# Patient Record
Sex: Male | Born: 1960
Health system: Southern US, Community
[De-identification: ages and names within clinical notes are randomized; demographics above are authoritative.]

## PROBLEM LIST (undated history)

## (undated) DIAGNOSIS — I10 Essential (primary) hypertension: Secondary | ICD-10-CM

## (undated) DIAGNOSIS — D751 Secondary polycythemia: Principal | ICD-10-CM

## (undated) DIAGNOSIS — K5792 Diverticulitis of intestine, part unspecified, without perforation or abscess without bleeding: Secondary | ICD-10-CM

## (undated) DIAGNOSIS — Z9049 Acquired absence of other specified parts of digestive tract: Secondary | ICD-10-CM

## (undated) DIAGNOSIS — K219 Gastro-esophageal reflux disease without esophagitis: Secondary | ICD-10-CM

## (undated) HISTORY — PX: OTHER SURGICAL HISTORY: SHX169

## (undated) HISTORY — DX: Secondary polycythemia: D75.1

## (undated) HISTORY — DX: Gastro-esophageal reflux disease without esophagitis: K21.9

## (undated) HISTORY — DX: Essential (primary) hypertension: I10

## (undated) HISTORY — DX: Diverticulitis of intestine, part unspecified, without perforation or abscess without bleeding: K57.92

## (undated) HISTORY — DX: Acquired absence of other specified parts of digestive tract: Z90.49

---

## 2011-10-13 ENCOUNTER — Telehealth: Payer: Self-pay | Admitting: *Deleted

## 2011-10-13 NOTE — Telephone Encounter (Signed)
Attempting to reach patient regarding referral. Patient is long distance truck driver, need to get more labs for chart and instruct about potential Phlebotomy on day of visit.

## 2011-10-14 ENCOUNTER — Telehealth: Payer: Self-pay | Admitting: Oncology

## 2011-10-14 ENCOUNTER — Telehealth: Payer: Self-pay | Admitting: *Deleted

## 2011-10-14 NOTE — Telephone Encounter (Signed)
called pt lmovm to rtn call to [facility] to schedule appt

## 2011-10-14 NOTE — Telephone Encounter (Signed)
Patient returned call, he will be available next week any afternoon for New Patient Appt. He understands he is coming in because his "blood is too high". Patient given basic information on preparing for potential phlebotomy for same day as visit. He is not able to donate blood at [facility] since he has tattoos. Spoke with Marcelino Duster in treatment scheduling, they can add phlebotomy same day of visit as late as 430pm. Will schedule afternoon appt with Dr Gaylyn Rong next week. Orders sent to scheduling.

## 2011-10-17 ENCOUNTER — Telehealth: Payer: Self-pay | Admitting: Oncology

## 2011-10-17 ENCOUNTER — Encounter: Payer: Self-pay | Admitting: Oncology

## 2011-10-17 DIAGNOSIS — D751 Secondary polycythemia: Secondary | ICD-10-CM

## 2011-10-17 HISTORY — DX: Secondary polycythemia: D75.1

## 2011-10-17 NOTE — Telephone Encounter (Signed)
S/w pt re appt for 5/15 w/HH.

## 2011-10-18 NOTE — Progress Notes (Signed)
Left message reminding patient of appointment tomorrow @ 10:30.

## 2011-10-19 ENCOUNTER — Encounter: Payer: Self-pay | Admitting: Oncology

## 2011-10-19 ENCOUNTER — Ambulatory Visit: Payer: BC Managed Care – PPO

## 2011-10-19 ENCOUNTER — Ambulatory Visit (HOSPITAL_BASED_OUTPATIENT_CLINIC_OR_DEPARTMENT_OTHER): Payer: BC Managed Care – PPO | Admitting: Oncology

## 2011-10-19 ENCOUNTER — Other Ambulatory Visit (HOSPITAL_BASED_OUTPATIENT_CLINIC_OR_DEPARTMENT_OTHER): Payer: BC Managed Care – PPO | Admitting: Lab

## 2011-10-19 VITALS — BP 159/100 | HR 74 | Temp 97.7°F | Ht 74.0 in | Wt 254.9 lb

## 2011-10-19 DIAGNOSIS — D751 Secondary polycythemia: Secondary | ICD-10-CM

## 2011-10-19 DIAGNOSIS — D45 Polycythemia vera: Secondary | ICD-10-CM

## 2011-10-19 LAB — CBC WITH DIFFERENTIAL/PLATELET
Basophils Absolute: 0.1 10*3/uL (ref 0.0–0.1)
EOS%: 2.1 % (ref 0.0–7.0)
HCT: 49.9 % (ref 38.4–49.9)
HGB: 17.2 g/dL — ABNORMAL HIGH (ref 13.0–17.1)
LYMPH%: 19.9 % (ref 14.0–49.0)
MONO%: 9 % (ref 0.0–14.0)
NEUT#: 4.2 10*3/uL (ref 1.5–6.5)
Platelets: 216 10*3/uL (ref 140–400)
RBC: 5.6 10*6/uL (ref 4.20–5.82)
RDW: 13.9 % (ref 11.0–14.6)
WBC: 6.1 10*3/uL (ref 4.0–10.3)
nRBC: 0 % (ref 0–0)

## 2011-10-19 NOTE — Progress Notes (Signed)
Please see consult note; dated same day.

## 2011-10-19 NOTE — Consult Note (Signed)
[facility]  Telephone:([phone]) [phone] [phone]     INITIAL HEMATOLOGY CONSULTATION    Referral MD:  Mr. Cleaster Corin, P-AC; Dr. Rudi Heap, M.D.  Reason for Referral: polycythemia.     HPI:  [name] is a 51 year-old man with no significant PMH.  The oldest CBC provided for review dated 02/22/2010 when WBC was 4.0; hgb 16.4; Plt 210.  Since then, he has had polycythemia.  On 08/10/2010, his WBC was 12.1; Hgb 17.3; and platelet 204.  On 10/03/2011, WBC was 6.2; Hgb 18.6; Plt 206.  Given persistent polycythemia, he was kindly referred to the [facility] for evaluation.  [name] presented to the Cancer Center today by himself.  He reported feeling relatively well.  He only has complained of mild dizziness when he traveled to [place] for work.  The dizziness spontaneous resolved.  There was no head ache, visual changes, dysphagia, dysarthria, focal motor weakness.  With respect to his polycythemia, he used to donate blood with [facility].  However, with polycythemia, he has been turned down.  His last pRBC donation was about 2 years ago.  He used to smoke 1-2 cigars once or twice monthly but quit many years ago.  He denies second hand smoke.  He used to drink lots of caffenated tea which he has tried to minimized the past month due to concern of possible hemo concentration.  He is not aware if he snores or if he has apnic periods during sleep.  He wakes up feeling well rested most of the time.    Patient denies fatigue, fever, anorexia, weight loss, headache, visual changes, confusion, drenching night sweats, palpable lymph node swelling, mucositis, odynophagia, dysphagia, nausea vomiting, jaundice, chest pain, palpitation, shortness of breath, dyspnea on exertion, productive cough, gum bleeding, epistaxis, hematemesis, hemoptysis, abdominal pain, abdominal swelling, early satiety, melena, hematochezia, hematuria, skin rash, spontaneous bleeding,  joint swelling, joint pain, heat or cold intolerance, bowel bladder incontinence, back pain, focal motor weakness, paresthesia, depression, suicidal or homocidal ideation, feeling hopelessness.   Past Medical History  Diagnosis Date  . Polycythemia 10/17/2011  . Hypertension   :    Past Surgical History  Procedure Date  . Dental extractions   :   CURRENT MEDS: Current Outpatient Prescriptions  Medication Sig Dispense Refill  . amLODipine (NORVASC) 5 MG tablet Take 5 mg by mouth Daily.      . cyanocobalamin 2000 MCG tablet Take 2,000 mcg by mouth daily.          No Known Allergies:  Family History  Problem Relation Age of Onset  . Cancer Maternal Grandfather     head and neck cancer  :  History   Social History  . Marital Status: Married    Spouse Name: N/A    Number of Children: 1  . Years of Education: N/A   Occupational History  .      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 Smoking status: Former Smoker -- 1 years    Types: Cigars  . Smokeless tobacco: Not on file  . Alcohol Use: No  . Drug Use: No  . Sexually Active: Yes    Birth Control/ Protection: None   Other Topics Concern  . Not on file   Social History Narrative  . No narrative on file  :  REVIEW OF SYSTEM:  The rest of the 14-point review of sytem was negative.   Exam: ECOG  0.   General:  well-nourished in no acute distress.  Eyes:  no scleral icterus.  ENT:  There were no oropharyngeal lesions.  However, there was redundant neck tissue, and his oropharynx aperture was rather small.   Neck was without thyromegaly.  Lymphatics:  Negative cervical, supraclavicular or axillary adenopathy.  Respiratory: lungs were clear bilaterally without wheezing or crackles.  Cardiovascular:  Regular rate and rhythm, S1/S2, without murmur, rub or gallop.  There was no pedal edema.  GI:  abdomen was soft, flat, nontender, nondistended, without organomegaly.  Muscoloskeletal:  no spinal  tenderness of palpation of vertebral spine.  There was no flank tenderness on percussion.  Skin exam was without echymosis, petichae.  Neuro exam was nonfocal.  Patient was able to get on and off exam table without assistance.  Gait was normal.  Patient was alerted and oriented.  Attention was good.   Language was appropriate.  Mood was normal without depression.  Speech was not pressured.  Thought content was not tangential.    LABS:  Lab Results  Component Value Date   WBC 6.1 10/19/2011   HGB 17.2* 10/19/2011   HCT 49.9 10/19/2011   PLT 216 10/19/2011     ASSESSMENT AND PLAN:   1.  Polycythemia: - Differential diagnosis: cannot rule out polycythemia vera.  However, his presentation can very well be due to hemo concentration due to tea consumption.  With cutting back on drinking tea this past month, his Hgb has trended down toward normal.  He has no symptoms to suggest occult malignancy.  He does not have strong sleep apnea symptoms; however cannot rule this out either.   - Work up:  I sent for JAK-2 mutation to rule out PV.  I sent for erythropoietin to ensure that it's not too elevated.  I recommended patient to talk with his PCP about a referral to sleep lab to rule out sleep apnea.   - Treatment:  I advised him to start ASA at least 81mg  PO daily for primary CAD/CVA protection and also to decrease risk of venous thrombosis.  If JAK-2 mutation is positive, meaning he has polycythemia vera, then I will bring patient back for regular phlebotomy with goal Hct <45.  Once patients with PV and without history of thrombosis turn 65, they may benefit from Hydrea to decrease risk of thrombosis.  However, if he turns out to have secondary polycythemia; then there is no indication for phlebotomy since it has not been proven to decrease risk of thrombosis.    2.  Age-appropriate cancer screening:  I strongly urged him to arrange to see GI to have a screening colonoscopy.    3.  Follow up:  Pending  result of JAK-2 mutation today.   Thank you for this referral.   The length of time of the face-to-face encounter was 30 minutes. More than 50% of time was spent counseling and coordination of care.

## 2011-10-20 LAB — COMPREHENSIVE METABOLIC PANEL
Alkaline Phosphatase: 55 U/L (ref 39–117)
Creatinine, Ser: 1 mg/dL (ref 0.50–1.35)
Glucose, Bld: 88 mg/dL (ref 70–99)
Sodium: 140 mEq/L (ref 135–145)
Total Bilirubin: 0.5 mg/dL (ref 0.3–1.2)
Total Protein: 7.2 g/dL (ref 6.0–8.3)

## 2011-10-24 LAB — JAK2 GENOTYPR

## 2013-02-21 ENCOUNTER — Other Ambulatory Visit: Payer: Self-pay

## 2013-02-21 MED ORDER — LISINOPRIL 20 MG PO TABS: 20.0000 mg | ORAL_TABLET | Freq: Every day | ORAL | Status: DC

## 2013-02-21 NOTE — Telephone Encounter (Signed)
Patient needs to be seen. Has exceeded time since last visit. Needs to bring all medications to next appointment. Last refill. 

## 2013-02-21 NOTE — Telephone Encounter (Signed)
Last seen 04/23/12  ACM 

## 2013-03-19 ENCOUNTER — Ambulatory Visit (INDEPENDENT_AMBULATORY_CARE_PROVIDER_SITE_OTHER): Payer: BC Managed Care – PPO | Admitting: Family Medicine

## 2013-03-19 ENCOUNTER — Encounter: Payer: Self-pay | Admitting: Family Medicine

## 2013-03-19 VITALS — BP 143/92 | HR 70 | Temp 98.5°F | Ht 74.0 in | Wt 248.0 lb

## 2013-03-19 DIAGNOSIS — I10 Essential (primary) hypertension: Secondary | ICD-10-CM

## 2013-03-19 MED ORDER — LISINOPRIL 20 MG PO TABS: 20.0000 mg | ORAL_TABLET | Freq: Every day | ORAL | Status: DC

## 2013-03-19 NOTE — Patient Instructions (Signed)

## 2013-03-19 NOTE — Progress Notes (Signed)
  Subjective:    Patient ID: Ladonte Verstraete, male    DOB: 12-26-1960, 52 y.o.   MRN: [id number]  HPI This 52 y.o. male presents for evaluation of hypertension.  He has been out of bp meds for A couple weeks.   Review of Systems No chest pain, SOB, HA, dizziness, vision change, N/V, diarrhea, constipation, dysuria, urinary urgency or frequency, myalgias, arthralgias or rash.     Objective:   Physical Exam Vital signs noted  Well developed well nourished male.  HEENT - Head atraumatic Normocephalic                Eyes - PERRLA, Conjuctiva - clear Sclera- Clear EOMI                Ears - EAC's Wnl TM's Wnl Gross Hearing WNL                Nose - Nares patent                 Throat - oropharanx wnl Respiratory - Lungs CTA bilateral Cardiac - RRR S1 and S2 without murmur GI - Abdomen soft Nontender and bowel sounds active x 4 Extremities - No edema. Neuro - Grossly intact.       Assessment & Plan:  Essential hypertension, benign - Plan: lisinopril (PRINIVIL,ZESTRIL) 20 MG tablet Follow up in 6 months  Deatra Canter FNP

## 2014-02-10 ENCOUNTER — Other Ambulatory Visit: Payer: Self-pay | Admitting: Family Medicine

## 2014-04-14 ENCOUNTER — Other Ambulatory Visit: Payer: Self-pay | Admitting: Family Medicine

## 2014-04-14 DIAGNOSIS — I10 Essential (primary) hypertension: Secondary | ICD-10-CM

## 2014-04-14 MED ORDER — LISINOPRIL 20 MG PO TABS: 20.0000 mg | ORAL_TABLET | Freq: Every day | ORAL | Status: DC

## 2014-04-14 NOTE — Telephone Encounter (Signed)
Pt aware  done

## 2014-05-19 ENCOUNTER — Encounter: Payer: Self-pay | Admitting: Family Medicine

## 2014-05-19 ENCOUNTER — Telehealth: Payer: Self-pay | Admitting: Family Medicine

## 2014-05-19 ENCOUNTER — Ambulatory Visit (INDEPENDENT_AMBULATORY_CARE_PROVIDER_SITE_OTHER): Payer: BC Managed Care – PPO | Admitting: Family Medicine

## 2014-05-19 VITALS — BP 139/88 | HR 90 | Temp 98.1°F | Ht 74.0 in | Wt 242.0 lb

## 2014-05-19 DIAGNOSIS — I10 Essential (primary) hypertension: Secondary | ICD-10-CM

## 2014-05-19 DIAGNOSIS — N528 Other male erectile dysfunction: Secondary | ICD-10-CM

## 2014-05-19 DIAGNOSIS — Z0000 Encounter for general adult medical examination without abnormal findings: Secondary | ICD-10-CM

## 2014-05-19 DIAGNOSIS — J0100 Acute maxillary sinusitis, unspecified: Secondary | ICD-10-CM

## 2014-05-19 LAB — POCT CBC
Granulocyte percent: 72.2 %G (ref 37–80)
HCT, POC: 50.6 % (ref 43.5–53.7)
Hemoglobin: 17 g/dL (ref 14.1–18.1)
Lymph, poc: 1.9 (ref 0.6–3.4)
MCH, POC: 30.2 pg (ref 27–31.2)
MCHC: 33.7 g/dL (ref 31.8–35.4)
MCV: 89.6 fL (ref 80–97)
MPV: 7.3 fL (ref 0–99.8)
POC Granulocyte: 6.4 (ref 2–6.9)
POC LYMPH PERCENT: 21.5 %L (ref 10–50)
Platelet Count, POC: 246 10*3/uL (ref 142–424)
RBC: 5.7 M/uL (ref 4.69–6.13)
RDW, POC: 13.5 %
WBC: 8.8 10*3/uL (ref 4.6–10.2)

## 2014-05-19 MED ORDER — METHYLPREDNISOLONE ACETATE 80 MG/ML IJ SUSP: 80.0000 mg | Freq: Once | INTRAMUSCULAR | Status: DC

## 2014-05-19 MED ORDER — SILDENAFIL CITRATE 100 MG PO TABS: ORAL_TABLET | ORAL | Status: DC

## 2014-05-19 MED ORDER — LISINOPRIL 20 MG PO TABS: 20.0000 mg | ORAL_TABLET | Freq: Every day | ORAL | Status: DC

## 2014-05-19 MED ORDER — AMOXICILLIN 875 MG PO TABS: 875.0000 mg | ORAL_TABLET | Freq: Two times a day (BID) | ORAL | Status: DC

## 2014-05-19 NOTE — Progress Notes (Signed)
   Subjective:    Patient ID: Allen Herring, male    DOB: Oct 15, 1960, 53 y.o.   MRN: [id number]  HPI Patient is here with c/o uri sx's.  He is due for cpe.  He is having ED sx's.  Review of Systems  Constitutional: Negative for fever.  HENT: Negative for ear pain.   Eyes: Negative for discharge.  Respiratory: Negative for cough.   Cardiovascular: Negative for chest pain.  Gastrointestinal: Negative for abdominal distention.  Endocrine: Negative for polyuria.  Genitourinary: Negative for difficulty urinating.  Musculoskeletal: Negative for gait problem and neck pain.  Skin: Negative for color change and rash.  Neurological: Negative for speech difficulty and headaches.  Psychiatric/Behavioral: Negative for agitation.       Objective:    BP 139/88 mmHg  Pulse 90  Temp(Src) 98.1 F (36.7 C) (Oral)  Ht $R'6\' 2"'tn$  (1.88 m)  Wt 242 lb (109.77 kg)  BMI 31.06 kg/m2 Physical Exam  Constitutional: He is oriented to person, place, and time. He appears well-developed and well-nourished.  HENT:  Head: Normocephalic and atraumatic.  Mouth/Throat: Oropharynx is clear and moist.  Eyes: Pupils are equal, round, and reactive to light.  Neck: Normal range of motion. Neck supple.  Cardiovascular: Normal rate and regular rhythm.   No murmur heard. Pulmonary/Chest: Effort normal and breath sounds normal.  Abdominal: Soft. Bowel sounds are normal. There is no tenderness.  Neurological: He is alert and oriented to person, place, and time.  Skin: Skin is warm and dry.  Psychiatric: He has a normal mood and affect.          Assessment & Plan:     ICD-9-CM ICD-10-CM   1. Subacute maxillary sinusitis 461.0 J01.00 amoxicillin (AMOXIL) 875 MG tablet     methylPREDNISolone acetate (DEPO-MEDROL) injection 80 mg  2. Routine general medical examination at a health care facility V70.0 Z00.00 POCT CBC     CMP14+EGFR     Lipid panel     PSA, total and free     Thyroid Panel With TSH  3. Other  male erectile dysfunction 607.84 N52.8 sildenafil (VIAGRA) 100 MG tablet  4. Essential hypertension, benign 401.1 I10 lisinopril (PRINIVIL,ZESTRIL) 20 MG tablet  5. Essential hypertension 401.9 I10 lisinopril (PRINIVIL,ZESTRIL) 20 MG tablet     No Follow-up on file.  Lysbeth Penner FNP

## 2014-05-20 LAB — CMP14+EGFR
ALT: 32 IU/L (ref 0–44)
AST: 28 IU/L (ref 0–40)
Albumin/Globulin Ratio: 1.6 (ref 1.1–2.5)
Albumin: 4.7 g/dL (ref 3.5–5.5)
Alkaline Phosphatase: 56 IU/L (ref 39–117)
BUN/Creatinine Ratio: 16 (ref 9–20)
BUN: 17 mg/dL (ref 6–24)
CO2: 24 mmol/L (ref 18–29)
Calcium: 10.3 mg/dL — ABNORMAL HIGH (ref 8.7–10.2)
Chloride: 101 mmol/L (ref 97–108)
Creatinine, Ser: 1.08 mg/dL (ref 0.76–1.27)
GFR calc Af Amer: 89 mL/min/{1.73_m2} (ref >59)
GFR calc non Af Amer: 77 mL/min/{1.73_m2} (ref >59)
Globulin, Total: 3 g/dL (ref 1.5–4.5)
Glucose: 86 mg/dL (ref 65–99)
Potassium: 4.7 mmol/L (ref 3.5–5.2)
Sodium: 141 mmol/L (ref 134–144)
Total Bilirubin: 0.4 mg/dL (ref 0.0–1.2)
Total Protein: 7.7 g/dL (ref 6.0–8.5)

## 2014-05-20 LAB — LIPID PANEL
Chol/HDL Ratio: 6.4 ratio units — ABNORMAL HIGH (ref 0.0–5.0)
Cholesterol, Total: 191 mg/dL (ref 100–199)
HDL: 30 mg/dL — ABNORMAL LOW (ref >39)
LDL Calculated: 115 mg/dL — ABNORMAL HIGH (ref 0–99)
Triglycerides: 230 mg/dL — ABNORMAL HIGH (ref 0–149)
VLDL Cholesterol Cal: 46 mg/dL — ABNORMAL HIGH (ref 5–40)

## 2014-05-20 LAB — PSA, TOTAL AND FREE
PSA, Free Pct: 41.1 %
PSA, Free: 0.37 ng/mL
PSA: 0.9 ng/mL (ref 0.0–4.0)

## 2014-05-20 LAB — THYROID PANEL WITH TSH
Free Thyroxine Index: 1.7 (ref 1.2–4.9)
T3 Uptake Ratio: 25 % (ref 24–39)
T4, Total: 6.6 ug/dL (ref 4.5–12.0)
TSH: 1.41 u[IU]/mL (ref 0.450–4.500)

## 2014-05-21 ENCOUNTER — Telehealth: Payer: Self-pay | Admitting: Family Medicine

## 2014-05-21 NOTE — Telephone Encounter (Signed)
Refill called in. 

## 2014-07-29 ENCOUNTER — Other Ambulatory Visit: Payer: Self-pay | Admitting: Family Medicine

## 2014-07-29 DIAGNOSIS — I10 Essential (primary) hypertension: Secondary | ICD-10-CM

## 2014-07-29 MED ORDER — LISINOPRIL 20 MG PO TABS
20.0000 mg | ORAL_TABLET | Freq: Every day | ORAL | Status: DC
Start: 2014-07-29 — End: 2015-04-17

## 2014-07-29 NOTE — Telephone Encounter (Signed)
done

## 2014-09-29 ENCOUNTER — Ambulatory Visit (INDEPENDENT_AMBULATORY_CARE_PROVIDER_SITE_OTHER): Payer: 59 | Admitting: Physician Assistant

## 2014-09-29 ENCOUNTER — Encounter: Payer: Self-pay | Admitting: Physician Assistant

## 2014-09-29 VITALS — BP 146/93 | HR 113 | Temp 98.2°F | Ht 74.0 in | Wt 245.0 lb

## 2014-09-29 DIAGNOSIS — K5792 Diverticulitis of intestine, part unspecified, without perforation or abscess without bleeding: Secondary | ICD-10-CM

## 2014-09-29 DIAGNOSIS — J309 Allergic rhinitis, unspecified: Secondary | ICD-10-CM | POA: Diagnosis not present

## 2014-09-29 DIAGNOSIS — J020 Streptococcal pharyngitis: Secondary | ICD-10-CM | POA: Diagnosis not present

## 2014-09-29 DIAGNOSIS — R509 Fever, unspecified: Secondary | ICD-10-CM | POA: Diagnosis not present

## 2014-09-29 LAB — POCT INFLUENZA A/B
INFLUENZA A, POC: NEGATIVE
INFLUENZA B, POC: NEGATIVE

## 2014-09-29 LAB — POCT RAPID STREP A (OFFICE): Rapid Strep A Screen: NEGATIVE

## 2014-09-29 MED ORDER — CETIRIZINE HCL 10 MG PO TABS: 10.0000 mg | ORAL_TABLET | Freq: Every day | ORAL | Status: DC

## 2014-09-29 MED ORDER — METRONIDAZOLE 500 MG PO TABS: 500.0000 mg | ORAL_TABLET | Freq: Three times a day (TID) | ORAL | Status: DC

## 2014-09-29 MED ORDER — CIPROFLOXACIN HCL 500 MG PO TABS: 500.0000 mg | ORAL_TABLET | Freq: Two times a day (BID) | ORAL | Status: DC

## 2014-09-29 MED ORDER — FLUTICASONE PROPIONATE 50 MCG/ACT NA SUSP: 2.0000 | Freq: Every day | NASAL | Status: DC

## 2014-09-29 NOTE — Progress Notes (Signed)
   Subjective:    Patient ID: Allen Herring, male    DOB: May 11, 1961, 54 y.o.   MRN: [id number]  HPI 54 y/o male presents with c/o fever last night 102, nasal congestion, vomiting/diarrhea. Symptoms began 2 days ago. Has tried Nyquil, Dayquil with no relief. No associated sick contacts. Recent travel to [place] the past 2 weeks, 3 weeks ago to [place]. Hx of diverticulitis    Review of Systems  Constitutional: Positive for fever, chills, diaphoresis, appetite change and fatigue.  HENT: Positive for congestion (nasal ), postnasal drip, rhinorrhea, sneezing and sore throat.   Respiratory: Positive for cough (nonproductive ). Negative for wheezing.   Cardiovascular: Negative.   Gastrointestinal: Positive for nausea, vomiting and diarrhea.  Musculoskeletal: Positive for myalgias.       Objective:   Physical Exam  Constitutional: He appears well-developed and well-nourished. No distress.  HENT:  Head: Normocephalic and atraumatic.  Eyes: Pupils are equal, round, and reactive to light.  Cardiovascular:  Hypertensive, tachycardic - possibly d/t decongestant.   Pulmonary/Chest: Effort normal and breath sounds normal. No respiratory distress. He has no wheezes.  Abdominal: Soft. Bowel sounds are normal. He exhibits no distension and no mass. There is tenderness (LLQ). There is guarding. There is no rebound.  Skin: He is not diaphoretic.  Nursing note and vitals reviewed.         Assessment & Plan:  1. Fever in adult  - POCT Influenza A/B negative  2. Streptococcal sore throat  - POCT rapid strep A negative  - Culture, Group A Strep  3. Allergic rhinitis, unspecified allergic rhinitis type  - cetirizine (ZYRTEC) 10 MG tablet; Take 1 tablet (10 mg total) by mouth daily.  Dispense: 30 tablet; Refill: 11 - fluticasone (FLONASE) 50 MCG/ACT nasal spray; Place 2 sprays into both nostrils daily.  Dispense: 16 g; Refill: 6  4. Diverticulitis of intestine without perforation or  abscess without bleeding  - ciprofloxacin (CIPRO) 500 MG tablet; Take 1 tablet (500 mg total) by mouth 2 (two) times daily.  Dispense: 20 tablet; Refill: 0 - metroNIDAZOLE (FLAGYL) 500 MG tablet; Take 1 tablet (500 mg total) by mouth 3 (three) times daily.  Dispense: 21 tablet; Refill: 0 - Advised patient to report to clinic or ED if symptoms worsen ( fever, black stool, increased vomiting.)    Instructions given on diet for diverticulitis.   Harman Langhans A. Benjamin Stain PA-C

## 2014-09-29 NOTE — Patient Instructions (Signed)
Diverticulitis Diverticulitis is when small pockets that have formed in your colon (large intestine) become infected or swollen. HOME CARE  Follow your doctor's instructions.  Follow a special diet if told by your doctor.  When you feel better, your doctor may tell you to change your diet. You may be told to eat a lot of fiber. Fruits and vegetables are good sources of fiber. Fiber makes it easier to poop (have bowel movements).  Take supplements or probiotics as told by your doctor.  Only take medicines as told by your doctor.  Keep all follow-up visits with your doctor. GET HELP IF:  Your pain does not get better.  You have a hard time eating food.  You are not pooping like normal. GET HELP RIGHT AWAY IF:  Your pain gets worse.  Your problems do not get better.  Your problems suddenly get worse.  You have a fever.  You keep throwing up (vomiting).  You have bloody or black, tarry poop (stool). MAKE SURE YOU:   Understand these instructions.  Will watch your condition.  Will get help right away if you are not doing well or get worse. Document Released: 11/09/2007 Document Revised: 05/28/2013 Document Reviewed: 04/17/2013 [facility] Patient Information 2015 [place], [place]. This information is not intended to replace advice given to you by your health care provider. Make sure you discuss any questions you have with your health care provider. Allergic Rhinitis Allergic rhinitis is when the mucous membranes in the nose respond to allergens. Allergens are particles in the air that cause your body to have an allergic reaction. This causes you to release allergic antibodies. Through a chain of events, these eventually cause you to release histamine into the blood stream. Although meant to protect the body, it is this release of histamine that causes your discomfort, such as frequent sneezing, congestion, and an itchy, runny nose.  CAUSES  Seasonal allergic rhinitis (hay fever)  is caused by pollen allergens that may come from grasses, trees, and weeds. Year-round allergic rhinitis (perennial allergic rhinitis) is caused by allergens such as house dust mites, pet dander, and mold spores.  SYMPTOMS   Nasal stuffiness (congestion).  Itchy, runny nose with sneezing and tearing of the eyes. DIAGNOSIS  Your health care provider can help you determine the allergen or allergens that trigger your symptoms. If you and your health care provider are unable to determine the allergen, skin or blood testing may be used. TREATMENT  Allergic rhinitis does not have a cure, but it can be controlled by:  Medicines and allergy shots (immunotherapy).  Avoiding the allergen. Hay fever may often be treated with antihistamines in pill or nasal spray forms. Antihistamines block the effects of histamine. There are over-the-counter medicines that may help with nasal congestion and swelling around the eyes. Check with your health care provider before taking or giving this medicine.  If avoiding the allergen or the medicine prescribed do not work, there are many new medicines your health care provider can prescribe. Stronger medicine may be used if initial measures are ineffective. Desensitizing injections can be used if medicine and avoidance does not work. Desensitization is when a patient is given ongoing shots until the body becomes less sensitive to the allergen. Make sure you follow up with your health care provider if problems continue. HOME CARE INSTRUCTIONS It is not possible to completely avoid allergens, but you can reduce your symptoms by taking steps to limit your exposure to them. It helps to know exactly what  you are allergic to so that you can avoid your specific triggers. SEEK MEDICAL CARE IF:   You have a fever.  You develop a cough that does not stop easily (persistent).  You have shortness of breath.  You start wheezing.  Symptoms interfere with normal daily  activities. Document Released: 02/15/2001 Document Revised: 05/28/2013 Document Reviewed: 01/28/2013 [facility] Patient Information 2015 [place], [place]. This information is not intended to replace advice given to you by your health care provider. Make sure you discuss any questions you have with your health care provider.

## 2014-10-03 LAB — CULTURE, GROUP A STREP

## 2015-04-17 ENCOUNTER — Encounter: Payer: Self-pay | Admitting: Family Medicine

## 2015-04-17 ENCOUNTER — Ambulatory Visit (INDEPENDENT_AMBULATORY_CARE_PROVIDER_SITE_OTHER): Payer: 59 | Admitting: Family Medicine

## 2015-04-17 VITALS — BP 135/89 | HR 91 | Temp 97.4°F | Ht 74.0 in | Wt 242.4 lb

## 2015-04-17 DIAGNOSIS — N529 Male erectile dysfunction, unspecified: Secondary | ICD-10-CM | POA: Diagnosis not present

## 2015-04-17 DIAGNOSIS — I10 Essential (primary) hypertension: Secondary | ICD-10-CM | POA: Diagnosis not present

## 2015-04-17 DIAGNOSIS — Z0000 Encounter for general adult medical examination without abnormal findings: Secondary | ICD-10-CM

## 2015-04-17 DIAGNOSIS — Z131 Encounter for screening for diabetes mellitus: Secondary | ICD-10-CM | POA: Diagnosis not present

## 2015-04-17 DIAGNOSIS — Z13220 Encounter for screening for lipoid disorders: Secondary | ICD-10-CM

## 2015-04-17 MED ORDER — TADALAFIL 5 MG PO TABS: 5.0000 mg | ORAL_TABLET | Freq: Every day | ORAL | Status: DC | PRN

## 2015-04-17 MED ORDER — LISINOPRIL 20 MG PO TABS: 20.0000 mg | ORAL_TABLET | Freq: Every day | ORAL | Status: DC

## 2015-04-17 NOTE — Progress Notes (Signed)
BP 135/89 mmHg  Pulse 91  Temp(Src) 97.4 F (36.3 C) (Oral)  Ht _0  (1.88 m)  Wt 242 lb 6.4 oz (109.952 kg)  BMI 31.11 kg/m2   Subjective:    Patient ID: Allen Herring, male    DOB: [birth year], 54 y.o.   MRN: [id number]  HPI: Allen Herring is a 54 y.o. male presenting on 04/17/2015 for Hypertension   HPI Well adult exam Patient comes in today for her yearly well exam and labs. Denies any chest pains or shortness of breath or urinary problems or bowel problems.  Hypertension Patient comes in for a hypertension recheck. He has been taking lisinopril 20 mg and has been doing well on that. He denies any side effects from the medication. His blood pressure seems to be controlled mostly on that medication. Patient denies headaches, blurred vision, chest pains, shortness of breath, or weakness. Denies any side effects from medication and is content with current medication.  Erectile dysfunction Patient also comes in for erectile dysfunction, he has been taking Viagra occasionally for this. He does not like it as much or the effects that he feels from it. He would like to try Cialis. He can get an erection but just has difficulty maintaining it. He can ejaculate but not every time.  Relevant past medical, surgical, family and social history reviewed and updated as indicated. Interim medical history since our last visit reviewed. Allergies and medications reviewed and updated.  Review of Systems  Constitutional: Negative for fever and chills.  HENT: Negative for ear discharge and ear pain.   Eyes: Negative for discharge and visual disturbance.  Respiratory: Negative for shortness of breath and wheezing.   Cardiovascular: Negative for chest pain and leg swelling.  Gastrointestinal: Negative for abdominal pain, diarrhea and constipation.  Genitourinary: Negative for urgency, frequency, hematuria, flank pain, decreased urine volume, discharge, scrotal swelling, difficulty urinating  and penile pain.  Musculoskeletal: Negative for back pain and gait problem.  Skin: Negative for rash.  Neurological: Negative for dizziness, syncope, light-headedness and headaches.  All other systems reviewed and are negative.   Per HPI unless specifically indicated above     Medication List       This list is accurate as of: 04/17/15 10:27 AM.  Always use your most recent med list.               cetirizine 10 MG tablet  Commonly known as:  ZYRTEC  Take 1 tablet (10 mg total) by mouth daily.     cyanocobalamin 2000 MCG tablet  Take 2,000 mcg by mouth daily.     fluticasone 50 MCG/ACT nasal spray  Commonly known as:  FLONASE  Place 2 sprays into both nostrils daily.     lisinopril 20 MG tablet  Commonly known as:  PRINIVIL,ZESTRIL  Take 1 tablet (20 mg total) by mouth daily.     tadalafil 5 MG tablet  Commonly known as:  CIALIS  Take 1 tablet (5 mg total) by mouth daily as needed for erectile dysfunction.           Objective:    BP 135/89 mmHg  Pulse 91  Temp(Src) 97.4 F (36.3 C) (Oral)  Ht _1  (1.88 m)  Wt 242 lb 6.4 oz (109.952 kg)  BMI 31.11 kg/m2  Wt Readings from Last 3 Encounters:  04/17/15 242 lb 6.4 oz (109.952 kg)  09/29/14 245 lb (111.131 kg)  05/19/14 242 lb (109.77 kg)    Physical Exam  Constitutional:  He is oriented to person, place, and time. He appears well-developed and well-nourished. No distress.  Eyes: Conjunctivae and EOM are normal. Pupils are equal, round, and reactive to light. Right eye exhibits no discharge. No scleral icterus.  Neck: Neck supple. No thyromegaly present.  Cardiovascular: Normal rate, regular rhythm, normal heart sounds and intact distal pulses.   No murmur heard. Pulmonary/Chest: Effort normal and breath sounds normal. No respiratory distress. He has no wheezes.  Musculoskeletal: Normal range of motion. He exhibits no edema.  Lymphadenopathy:    He has no cervical adenopathy.  Neurological: He is alert and  oriented to person, place, and time. Coordination normal.  Skin: Skin is warm and dry. No rash noted. He is not diaphoretic.  Psychiatric: He has a normal mood and affect. His behavior is normal.  Nursing note and vitals reviewed.   Results for orders placed or performed in visit on 09/29/14  Culture, Group A Strep  Result Value Ref Range   Strep A Culture Comment (A)   POCT Influenza A/B  Result Value Ref Range   Influenza A, POC Negative    Influenza B, POC Negative   POCT rapid strep A  Result Value Ref Range   Rapid Strep A Screen Negative Negative      Assessment & Plan:   Problem List Items Addressed This Visit    None    Visit Diagnoses    Essential hypertension, benign    -  Primary    Relevant Medications    tadalafil (CIALIS) 5 MG tablet    lisinopril (PRINIVIL,ZESTRIL) 20 MG tablet    Other Relevant Orders    CMP14+EGFR    Well adult exam        Screening for diabetes mellitus        Relevant Orders    CMP14+EGFR    Screening, lipid        Relevant Orders    Lipid panel    Erectile dysfunction, unspecified erectile dysfunction type        Relevant Medications    tadalafil (CIALIS) 5 MG tablet       Patient did not want to do a colonoscopy at this time, he also does not want prostate screening. He also does not want a flu shot at this time.  Follow up plan: Return in about 1 year (around 04/16/2016), or if symptoms worsen or fail to improve, for htn recheck.  Caryl Pina, MD [facility] 04/17/2015, 10:27 AM

## 2015-04-18 LAB — CMP14+EGFR
ALBUMIN: 4.8 g/dL (ref 3.5–5.5)
ALT: 35 IU/L (ref 0–44)
AST: 31 IU/L (ref 0–40)
Albumin/Globulin Ratio: 1.8 (ref 1.1–2.5)
Alkaline Phosphatase: 55 IU/L (ref 39–117)
BUN / CREAT RATIO: 15 (ref 9–20)
BUN: 15 mg/dL (ref 6–24)
Bilirubin Total: 0.5 mg/dL (ref 0.0–1.2)
CALCIUM: 10.1 mg/dL (ref 8.7–10.2)
CO2: 21 mmol/L (ref 18–29)
CREATININE: 0.97 mg/dL (ref 0.76–1.27)
Chloride: 101 mmol/L (ref 97–106)
GFR calc Af Amer: 101 mL/min/{1.73_m2} (ref >59)
GFR, EST NON AFRICAN AMERICAN: 88 mL/min/{1.73_m2} (ref >59)
GLOBULIN, TOTAL: 2.6 g/dL (ref 1.5–4.5)
Glucose: 94 mg/dL (ref 65–99)
Potassium: 4.3 mmol/L (ref 3.5–5.2)
SODIUM: 141 mmol/L (ref 136–144)
TOTAL PROTEIN: 7.4 g/dL (ref 6.0–8.5)

## 2015-04-18 LAB — LIPID PANEL
Chol/HDL Ratio: 6.1 ratio units — ABNORMAL HIGH (ref 0.0–5.0)
Cholesterol, Total: 188 mg/dL (ref 100–199)
HDL: 31 mg/dL — ABNORMAL LOW (ref >39)
LDL CALC: 104 mg/dL — AB (ref 0–99)
Triglycerides: 263 mg/dL — ABNORMAL HIGH (ref 0–149)
VLDL CHOLESTEROL CAL: 53 mg/dL — AB (ref 5–40)

## 2015-04-21 ENCOUNTER — Other Ambulatory Visit: Payer: Self-pay

## 2015-04-21 MED ORDER — FENOFIBRATE 54 MG PO TABS: 54.0000 mg | ORAL_TABLET | Freq: Every day | ORAL | Status: DC

## 2015-04-21 NOTE — Progress Notes (Signed)
Patient informed, medication called in to [facility]

## 2015-06-23 ENCOUNTER — Encounter: Payer: Self-pay | Admitting: Pediatrics

## 2015-06-23 ENCOUNTER — Ambulatory Visit (INDEPENDENT_AMBULATORY_CARE_PROVIDER_SITE_OTHER): Payer: 59 | Admitting: Pediatrics

## 2015-06-23 VITALS — BP 158/102 | HR 79 | Temp 98.1°F | Ht 74.0 in | Wt 250.8 lb

## 2015-06-23 DIAGNOSIS — R6889 Other general symptoms and signs: Secondary | ICD-10-CM

## 2015-06-23 DIAGNOSIS — J069 Acute upper respiratory infection, unspecified: Secondary | ICD-10-CM | POA: Diagnosis not present

## 2015-06-23 NOTE — Patient Instructions (Addendum)
Netipot with distilled water 2-3 times a day to clear out sinuses Or Normal saline nasal spray Flonase steroid nasal spray Ibuprofen or nabutetome Lots of fluids  Take your blood pressure at home Let me know if regularly >140 on top or >90 on bottom

## 2015-06-23 NOTE — Progress Notes (Signed)
    Subjective:    Patient ID: Allen Herring, male    DOB: 07-13-1960, 55 y.o.   MRN: [id number]  CC: Cough; Shortness of Breath; Fever; and Nasal Congestion   HPI: Allen Herring is a 55 y.o. male presenting for Cough; Shortness of Breath; Fever; and Nasal Congestion  Still coughing, coughing some up Started 4 days ago Some people at work have been sick Some fevers Feels SOB with exertion now Taking tylenol flu, day and night Muscle aches, body aches   Depression screen [facility] 2/9 06/23/2015 04/17/2015  Decreased Interest 0 0  Down, Depressed, Hopeless 0 0  PHQ - 2 Score 0 0     Relevant past medical, surgical, family and social history reviewed and updated as indicated. Interim medical history since our last visit reviewed. Allergies and medications reviewed and updated.    ROS: Per HPI unless specifically indicated above  History  Smoking status  . Former Smoker -- 1 years  . Types: Cigars  Smokeless tobacco  . Never Used    Past Medical History Patient Active Problem List   Diagnosis Date Noted  . Polycythemia 10/17/2011    Current Outpatient Prescriptions  Medication Sig Dispense Refill  . fluticasone (FLONASE) 50 MCG/ACT nasal spray Place 2 sprays into both nostrils daily. 16 g 6  . lisinopril (PRINIVIL,ZESTRIL) 20 MG tablet Take 1 tablet (20 mg total) by mouth daily. 90 tablet 6  . nabumetone (RELAFEN) 750 MG tablet Take 750 mg by mouth 2 (two) times daily.     No current facility-administered medications for this visit.       Objective:    BP 158/102 mmHg  Pulse 79  Temp(Src) 98.1 F (36.7 C) (Oral)  Ht 6\' 2"  (1.88 m)  Wt 250 lb 12.8 oz (113.762 kg)  BMI 32.19 kg/m2  SpO2 97%  Wt Readings from Last 3 Encounters:  06/23/15 250 lb 12.8 oz (113.762 kg)  04/17/15 242 lb 6.4 oz (109.952 kg)  09/29/14 245 lb (111.131 kg)    Gen: NAD, alert, cooperative with exam, NCAT, warm, sweaty EYES: EOMI, no scleral injection or icterus ENT:  TMs pearly  gray b/l, OP with mild erythema LYMPH: +<1cm ant cervical LAD CV: NRRR, normal S1/S2, no murmur, distal pulses 2+ b/l Resp: CTABL, no wheezes, normal WOB Abd: +BS, soft, NTND. no guarding or organomegaly Ext: No edema, warm Neuro: Alert and oriented, strength equal b/l UE and LE, coordination grossly normal MSK: normal muscle bulk     Assessment & Plan:    Allen Herring was seen today for flu-like symptoms. Ongoing past 4 days, too late for tamiflu. Offered flu test, but discussed too late to treat with tamiflu. Discussed symptomatic care, return precautions.  Diagnoses and all orders for this visit:  Acute URI  Flu-like symptoms    Follow up plan: Return if symptoms worsen or fail to improve, for 2 weeks for BP recheck with nurse.  Assunta Found, MD [facility] 06/23/2015, 2:22 PM

## 2015-10-28 ENCOUNTER — Telehealth: Payer: Self-pay | Admitting: Family Medicine

## 2015-10-30 ENCOUNTER — Ambulatory Visit: Payer: 59 | Admitting: Family Medicine

## 2015-11-06 ENCOUNTER — Telehealth: Payer: Self-pay | Admitting: Family Medicine

## 2015-11-06 NOTE — Telephone Encounter (Signed)
Going to request records from [facility]

## 2016-05-06 HISTORY — PX: CERVICAL SPINE SURGERY: SHX589

## 2016-05-19 ENCOUNTER — Other Ambulatory Visit: Payer: Self-pay | Admitting: Family Medicine

## 2016-05-19 DIAGNOSIS — I10 Essential (primary) hypertension: Secondary | ICD-10-CM

## 2016-06-28 ENCOUNTER — Encounter: Payer: Self-pay | Admitting: Family Medicine

## 2016-06-28 ENCOUNTER — Ambulatory Visit (INDEPENDENT_AMBULATORY_CARE_PROVIDER_SITE_OTHER): Payer: 59 | Admitting: Family Medicine

## 2016-06-28 VITALS — BP 127/88 | HR 114 | Temp 99.5°F | Ht 74.0 in | Wt 242.0 lb

## 2016-06-28 DIAGNOSIS — R509 Fever, unspecified: Secondary | ICD-10-CM | POA: Diagnosis not present

## 2016-06-28 DIAGNOSIS — I10 Essential (primary) hypertension: Secondary | ICD-10-CM

## 2016-06-28 MED ORDER — LISINOPRIL 20 MG PO TABS: 20.0000 mg | ORAL_TABLET | Freq: Every day | ORAL | 1 refills | Status: DC

## 2016-06-28 MED ORDER — OSELTAMIVIR PHOSPHATE 75 MG PO CAPS: 75.0000 mg | ORAL_CAPSULE | Freq: Two times a day (BID) | ORAL | 0 refills | Status: DC

## 2016-06-28 NOTE — Progress Notes (Signed)
   Subjective:    Patient ID: Allen Herring, male    DOB: 17-Mar-1961, 56 y.o.   MRN: [id number]  HPI onset yesterday afternoon of fever and myalgias. There is some slight cough and some headache. He has been exposed to flu. OTC medicines include Dayquil  Patient Active Problem List   Diagnosis Date Noted  . Polycythemia 10/17/2011   Outpatient Encounter Prescriptions as of 06/28/2016  Medication Sig  . cyclobenzaprine (FLEXERIL) 10 MG tablet Take 10 mg by mouth.  . docusate sodium (COLACE) 100 MG capsule Take 100 mg by mouth 2 (two) times daily as needed for mild constipation.  . gabapentin (NEURONTIN) 300 MG capsule Take 300 mg by mouth 3 (three) times daily.  Marland Kitchen lisinopril (PRINIVIL,ZESTRIL) 20 MG tablet Take 1 tablet (20 mg total) by mouth daily.  . [DISCONTINUED] lisinopril (PRINIVIL,ZESTRIL) 20 MG tablet TAKE ONE TABLET BY MOUTH ONCE DAILY  . fluticasone (FLONASE) 50 MCG/ACT nasal spray Place 2 sprays into both nostrils daily. (Patient not taking: Reported on 06/28/2016)  . oseltamivir (TAMIFLU) 75 MG capsule Take 1 capsule (75 mg total) by mouth 2 (two) times daily.  . [DISCONTINUED] nabumetone (RELAFEN) 750 MG tablet Take 750 mg by mouth 2 (two) times daily.   No facility-administered encounter medications on file as of 06/28/2016.       Review of Systems  Constitutional: Positive for fever.  Respiratory: Positive for cough.   Musculoskeletal: Positive for myalgias.       Objective:   Physical Exam  Constitutional: He is oriented to person, place, and time. He appears well-developed and well-nourished.  HENT:  Mouth/Throat: Oropharynx is clear and moist.  Cardiovascular: Normal rate, regular rhythm and normal heart sounds.   Pulmonary/Chest: Effort normal and breath sounds normal.  Neurological: He is oriented to person, place, and time.   BP 127/88   Pulse (!) 114   Temp 99.5 F (37.5 C) (Oral)   Ht 6\' 2"  (1.88 m)   Wt 242 lb (109.8 kg)   BMI 31.07 kg/m         Assessment & Plan:  1. Essential hypertension, benign Blood pressure is fairly well controlled on lisinopril. Continue same - lisinopril (PRINIVIL,ZESTRIL) 20 MG tablet; Take 1 tablet (20 mg total) by mouth daily.  Dispense: 90 tablet; Refill: 1  2. Acute febrile illness We discussed and offered him flu test but am willing to treat based on symptoms. He does not have classical flu symptoms but I think with fever over 102 and myalgias that's the likely diagnosis. Rx: Tamiflu 75 mg twice a day. He has hydrocodone from his recent neck surgery and he may take for  Wardell Honour MD

## 2016-08-16 ENCOUNTER — Other Ambulatory Visit: Payer: Self-pay | Admitting: Family Medicine

## 2016-08-16 DIAGNOSIS — I10 Essential (primary) hypertension: Secondary | ICD-10-CM

## 2016-08-22 ENCOUNTER — Other Ambulatory Visit: Payer: Self-pay | Admitting: Family Medicine

## 2016-08-22 ENCOUNTER — Telehealth: Payer: Self-pay | Admitting: Family Medicine

## 2016-08-22 DIAGNOSIS — I10 Essential (primary) hypertension: Secondary | ICD-10-CM

## 2016-08-22 MED ORDER — LISINOPRIL 20 MG PO TABS: 20.0000 mg | ORAL_TABLET | Freq: Every day | ORAL | 1 refills | Status: DC

## 2016-08-22 NOTE — Telephone Encounter (Signed)
done

## 2016-10-18 ENCOUNTER — Encounter: Payer: Self-pay | Admitting: Family

## 2016-10-18 ENCOUNTER — Ambulatory Visit (INDEPENDENT_AMBULATORY_CARE_PROVIDER_SITE_OTHER): Payer: 59 | Admitting: Family

## 2016-10-18 VITALS — BP 128/83 | HR 80 | Temp 97.8°F | Ht 74.0 in | Wt 241.8 lb

## 2016-10-18 DIAGNOSIS — I10 Essential (primary) hypertension: Secondary | ICD-10-CM | POA: Diagnosis not present

## 2016-10-18 DIAGNOSIS — J301 Allergic rhinitis due to pollen: Secondary | ICD-10-CM

## 2016-10-18 MED ORDER — LISINOPRIL 20 MG PO TABS: 20.0000 mg | ORAL_TABLET | Freq: Every day | ORAL | 3 refills | Status: DC

## 2016-10-18 MED ORDER — FLUTICASONE PROPIONATE 50 MCG/ACT NA SUSP: 2.0000 | Freq: Every day | NASAL | 6 refills | Status: DC

## 2016-10-18 MED ORDER — CETIRIZINE HCL 10 MG PO TABS: 10.0000 mg | ORAL_TABLET | Freq: Every day | ORAL | 11 refills | Status: DC

## 2016-10-18 NOTE — Patient Instructions (Signed)
Allergic Rhinitis Allergic rhinitis is when the mucous membranes in the nose respond to allergens. Allergens are particles in the air that cause your body to have an allergic reaction. This causes you to release allergic antibodies. Through a chain of events, these eventually cause you to release histamine into the blood stream. Although meant to protect the body, it is this release of histamine that causes your discomfort, such as frequent sneezing, congestion, and an itchy, runny nose. What are the causes? Seasonal allergic rhinitis (hay fever) is caused by pollen allergens that may come from grasses, trees, and weeds. Year-round allergic rhinitis (perennial allergic rhinitis) is caused by allergens such as house dust mites, pet dander, and mold spores. What are the signs or symptoms?  Nasal stuffiness (congestion).  Itchy, runny nose with sneezing and tearing of the eyes. How is this diagnosed? Your health care provider can help you determine the allergen or allergens that trigger your symptoms. If you and your health care provider are unable to determine the allergen, skin or blood testing may be used. Your health care provider will diagnose your condition after taking your health history and performing a physical exam. Your health care provider may assess you for other related conditions, such as asthma, pink eye, or an ear infection. How is this treated? Allergic rhinitis does not have a cure, but it can be controlled by:  Medicines that block allergy symptoms. These may include allergy shots, nasal sprays, and oral antihistamines.  Avoiding the allergen. Hay fever may often be treated with antihistamines in pill or nasal spray forms. Antihistamines block the effects of histamine. There are over-the-counter medicines that may help with nasal congestion and swelling around the eyes. Check with your health care provider before taking or giving this medicine. If avoiding the allergen or the  medicine prescribed do not work, there are many new medicines your health care provider can prescribe. Stronger medicine may be used if initial measures are ineffective. Desensitizing injections can be used if medicine and avoidance does not work. Desensitization is when a patient is given ongoing shots until the body becomes less sensitive to the allergen. Make sure you follow up with your health care provider if problems continue. Follow these instructions at home: It is not possible to completely avoid allergens, but you can reduce your symptoms by taking steps to limit your exposure to them. It helps to know exactly what you are allergic to so that you can avoid your specific triggers. Contact a health care provider if:  You have a fever.  You develop a cough that does not stop easily (persistent).  You have shortness of breath.  You start wheezing.  Symptoms interfere with normal daily activities. This information is not intended to replace advice given to you by your health care provider. Make sure you discuss any questions you have with your health care provider. Document Released: 02/15/2001 Document Revised: 01/22/2016 Document Reviewed: 01/28/2013 Elsevier Interactive Patient Education  2017 Elsevier Inc.  

## 2016-10-18 NOTE — Progress Notes (Signed)
   Subjective:    Patient ID: Allen Herring, male    DOB: 1960/06/12, 56 y.o.   MRN: [id number]  Sinus Problem  The current episode started in the past 7 days. The problem has been waxing and waning since onset. There has been no fever. His pain is at a severity of 4/10. The pain is mild. Associated symptoms include headaches and sinus pressure. Pertinent negatives include no congestion, coughing, ear pain, hoarse voice, shortness of breath or sore throat. Past treatments include nothing.  Hypertension  This is a chronic problem. The current episode started more than 1 year ago. The problem has been resolved since onset. The problem is controlled. Associated symptoms include headaches. Pertinent negatives include no malaise/fatigue, peripheral edema or shortness of breath. The current treatment provides moderate improvement.      Review of Systems  Constitutional: Negative for malaise/fatigue.  HENT: Positive for sinus pressure. Negative for congestion, ear pain, hoarse voice and sore throat.   Respiratory: Negative for cough and shortness of breath.   Neurological: Positive for headaches.  All other systems reviewed and are negative.      Objective:   Physical Exam  Constitutional: He is oriented to person, place, and time. He appears well-developed and well-nourished. No distress.  HENT:  Head: Normocephalic.  Right Ear: External ear normal.  Left Ear: External ear normal.  Mouth/Throat: Oropharynx is clear and moist.  Eyes: Pupils are equal, round, and reactive to light. Right eye exhibits no discharge. Left eye exhibits no discharge.  Neck: Normal range of motion. Neck supple. No thyromegaly present.  Cardiovascular: Normal rate, regular rhythm, normal heart sounds and intact distal pulses.   No murmur heard. Pulmonary/Chest: Effort normal and breath sounds normal. No respiratory distress. He has no wheezes.  Abdominal: Soft. Bowel sounds are normal. He exhibits no distension.  There is no tenderness.  Musculoskeletal: Normal range of motion. He exhibits no edema or tenderness.  Neurological: He is alert and oriented to person, place, and time.  Skin: Skin is warm and dry. No rash noted. No erythema.  Psychiatric: He has a normal mood and affect. His behavior is normal. Judgment and thought content normal.  Vitals reviewed.     BP 128/83   Pulse 80   Temp 97.8 F (36.6 C) (Oral)   Ht 6\' 2"  (1.88 m)   Wt 241 lb 12.8 oz (109.7 kg)   BMI 31.05 kg/m      Assessment & Plan:  1. Essential hypertension -Dash diet information given -Exercise encouraged - Stress Management  -Continue current meds -RTO in as needed and keep chronic follow up with PCP - lisinopril (PRINIVIL,ZESTRIL) 20 MG tablet; Take 1 tablet (20 mg total) by mouth daily.  Dispense: 90 tablet; Refill: 3  2. Allergic rhinitis due to pollen, unspecified seasonality -Avoid allergens Start zyrtec and flonase daily RTO prn  - fluticasone (FLONASE) 50 MCG/ACT nasal spray; Place 2 sprays into both nostrils daily.  Dispense: 16 g; Refill: 6 - cetirizine (ZYRTEC) 10 MG tablet; Take 1 tablet (10 mg total) by mouth daily.  Dispense: 30 tablet; Refill: [place], FNP

## 2017-08-07 ENCOUNTER — Encounter: Payer: Self-pay | Admitting: Nurse Practitioner

## 2017-08-07 ENCOUNTER — Ambulatory Visit: Payer: 59 | Admitting: Nurse Practitioner

## 2017-08-07 VITALS — BP 133/93 | HR 76 | Temp 97.7°F | Ht 74.0 in | Wt 241.6 lb

## 2017-08-07 DIAGNOSIS — J301 Allergic rhinitis due to pollen: Secondary | ICD-10-CM | POA: Diagnosis not present

## 2017-08-07 DIAGNOSIS — I10 Essential (primary) hypertension: Secondary | ICD-10-CM | POA: Diagnosis not present

## 2017-08-07 DIAGNOSIS — J0100 Acute maxillary sinusitis, unspecified: Secondary | ICD-10-CM

## 2017-08-07 DIAGNOSIS — M25532 Pain in left wrist: Secondary | ICD-10-CM

## 2017-08-07 DIAGNOSIS — D751 Secondary polycythemia: Secondary | ICD-10-CM | POA: Diagnosis not present

## 2017-08-07 DIAGNOSIS — J309 Allergic rhinitis, unspecified: Secondary | ICD-10-CM | POA: Insufficient documentation

## 2017-08-07 MED ORDER — AMOXICILLIN-POT CLAVULANATE 875-125 MG PO TABS: 1.0000 | ORAL_TABLET | Freq: Two times a day (BID) | ORAL | 0 refills | Status: DC

## 2017-08-07 MED ORDER — FLUTICASONE PROPIONATE 50 MCG/ACT NA SUSP: 2.0000 | Freq: Every day | NASAL | 6 refills | Status: DC

## 2017-08-07 MED ORDER — LISINOPRIL 20 MG PO TABS: 20.0000 mg | ORAL_TABLET | Freq: Every day | ORAL | 1 refills | Status: DC

## 2017-08-07 NOTE — Patient Instructions (Signed)

## 2017-08-07 NOTE — Progress Notes (Signed)
Subjective:    Patient ID: Allen Herring, male    DOB: 03/01/1961, 57 y.o.   MRN: [id number]  HPI  Allen Herring is here today for follow up of chronic medical problem.  Outpatient Encounter Medications as of 08/07/2017  Medication Sig  . fluticasone (FLONASE) 50 MCG/ACT nasal spray Place 2 sprays into both nostrils daily.  Marland Kitchen lisinopril (PRINIVIL,ZESTRIL) 20 MG tablet Take 1 tablet (20 mg total) by mouth daily.    1. Essential hypertension  No chest pain, sob or headaches. Does  Not check blood pressure at home. He has been out of blood pressure meds for 3 days. BP Readings from Last 3 Encounters:  08/07/17 (!) 133/93  10/18/16 128/83  06/28/16 127/88     2. Polycythemia  He is unsure about this. He was told at one tome that his blood count was messed up but they did not do anything about it and he was not sent to specialist.  3. Seasonal allergic rhinitis due to pollen  He is currently on nasal spray which works well for him.    New complaints: -He is c/o fever- 101 and 102 yesterday evening. Cough , congestion and slight body aches.  - left wrist pain- had workers comp injury close to one year ago and has not been settled as of yet. Pain in wrist is increasing and he wants referral back to [facility].  Social history: Works at [employer]. Lives with girlfriend     Review of Systems  Constitutional: Positive for fever. Negative for appetite change and chills.  HENT: Positive for congestion, sinus pressure, sinus pain and sore throat. Negative for ear pain and trouble swallowing.   Respiratory: Positive for cough (slightly productive).   Cardiovascular: Negative.   Genitourinary: Negative.   Neurological: Negative.   Psychiatric/Behavioral: Negative.   All other systems reviewed and are negative.      Objective:   Physical Exam  Constitutional: He is oriented to person, place, and time. He appears well-developed and well-nourished. No distress.    HENT:  Right Ear: Hearing, tympanic membrane, external ear and ear canal normal.  Left Ear: Hearing, tympanic membrane, external ear and ear canal normal.  Nose: Mucosal edema and rhinorrhea present. Right sinus exhibits maxillary sinus tenderness. Right sinus exhibits no frontal sinus tenderness. Left sinus exhibits maxillary sinus tenderness. Left sinus exhibits no frontal sinus tenderness.  Mouth/Throat: Uvula is midline, oropharynx is clear and moist and mucous membranes are normal.  Neck: Normal range of motion. Neck supple.  Cardiovascular: Normal rate and regular rhythm.  Pulmonary/Chest: Effort normal and breath sounds normal.  Abdominal: Soft. Bowel sounds are normal.  Musculoskeletal:  FROM of left wrist withpot pain- but has pain if hand if gripping something and moving wrist. No edema  Lymphadenopathy:    He has no cervical adenopathy.  Neurological: He is alert and oriented to person, place, and time.  Skin: Skin is warm.  Psychiatric: He has a normal mood and affect. His behavior is normal. Judgment and thought content normal.    BP (!) 133/93   Pulse 76   Temp 97.7 F (36.5 C) (Oral)   Ht '6\' 2"'$  (1.88 m)   Wt 241 lb 9.6 oz (109.6 kg)   BMI 31.02 kg/m        Assessment & Plan:  1. Essential hypertension Low sodium diet - CMP14+EGFR - Lipid panel - lisinopril (PRINIVIL,ZESTRIL) 20 MG tablet; Take 1 tablet (20 mg total) by mouth daily.  Dispense: 90  tablet; Refill: 1  2. Polycythemia Labs pending - CBC with Differential/Platelet  3. Seasonal allergic rhinitis due to pollen - fluticasone (FLONASE) 50 MCG/ACT nasal spray; Place 2 sprays into both nostrils daily.  Dispense: 16 g; Refill: 6  4. Left wrist pain - Ambulatory referral to Orthopedic Surgery  5. Acute maxillary sinusitis, recurrence not specified 1. Take meds as prescribed 2. Use a cool mist humidifier especially during the winter months and when heat has been humid. 3. Use saline nose sprays  frequently 4. Saline irrigations of the nose can be very helpful if done frequently.  * 4X daily for 1 week*  * Use of a nettie pot can be helpful with this. Follow directions with this* 5. Drink plenty of fluids 6. Keep thermostat turn down low 7.For any cough or congestion  Use plain Mucinex- regular strength or max strength is fine   * Children- consult with Pharmacist for dosing 8. For fever or aces or pains- take tylenol or ibuprofen appropriate for age and weight.  * for fevers greater than 101 orally you may alternate ibuprofen and tylenol every  3 hours.    - amoxicillin-clavulanate (AUGMENTIN) 875-125 MG tablet; Take 1 tablet by mouth 2 (two) times daily.  Dispense: 20 tablet; Refill: 0    Labs pending Health maintenance reviewed Diet and exercise encouraged Continue all meds Follow up  In 6 months   [place], FNP

## 2017-08-08 LAB — CMP14+EGFR
A/G RATIO: 1.7 (ref 1.2–2.2)
ALT: 31 IU/L (ref 0–44)
AST: 30 IU/L (ref 0–40)
Albumin: 4.7 g/dL (ref 3.5–5.5)
Alkaline Phosphatase: 56 IU/L (ref 39–117)
BUN/Creatinine Ratio: 13 (ref 9–20)
BUN: 12 mg/dL (ref 6–24)
Bilirubin Total: 0.5 mg/dL (ref 0.0–1.2)
CALCIUM: 9.8 mg/dL (ref 8.7–10.2)
CO2: 23 mmol/L (ref 20–29)
Chloride: 103 mmol/L (ref 96–106)
Creatinine, Ser: 0.95 mg/dL (ref 0.76–1.27)
GFR calc Af Amer: 102 mL/min/{1.73_m2} (ref >59)
GFR, EST NON AFRICAN AMERICAN: 88 mL/min/{1.73_m2} (ref >59)
GLOBULIN, TOTAL: 2.8 g/dL (ref 1.5–4.5)
Glucose: 87 mg/dL (ref 65–99)
POTASSIUM: 4.4 mmol/L (ref 3.5–5.2)
SODIUM: 141 mmol/L (ref 134–144)
TOTAL PROTEIN: 7.5 g/dL (ref 6.0–8.5)

## 2017-08-08 LAB — LIPID PANEL
Chol/HDL Ratio: 4.9 ratio (ref 0.0–5.0)
Cholesterol, Total: 172 mg/dL (ref 100–199)
HDL: 35 mg/dL — ABNORMAL LOW (ref >39)
LDL Calculated: 109 mg/dL — ABNORMAL HIGH (ref 0–99)
Triglycerides: 142 mg/dL (ref 0–149)
VLDL CHOLESTEROL CAL: 28 mg/dL (ref 5–40)

## 2017-08-08 LAB — CBC WITH DIFFERENTIAL/PLATELET
BASOS: 0 %
Basophils Absolute: 0 10*3/uL (ref 0.0–0.2)
EOS (ABSOLUTE): 0.3 10*3/uL (ref 0.0–0.4)
EOS: 4 %
HEMATOCRIT: 48 % (ref 37.5–51.0)
Hemoglobin: 17.1 g/dL (ref 13.0–17.7)
IMMATURE GRANS (ABS): 0 10*3/uL (ref 0.0–0.1)
Immature Granulocytes: 0 %
Lymphocytes Absolute: 1.3 10*3/uL (ref 0.7–3.1)
Lymphs: 19 %
MCH: 30.4 pg (ref 26.6–33.0)
MCHC: 35.6 g/dL (ref 31.5–35.7)
MCV: 85 fL (ref 79–97)
MONOS ABS: 0.7 10*3/uL (ref 0.1–0.9)
Monocytes: 9 %
NEUTROS ABS: 4.7 10*3/uL (ref 1.4–7.0)
NEUTROS PCT: 68 %
Platelets: 208 10*3/uL (ref 150–379)
RBC: 5.62 x10E6/uL (ref 4.14–5.80)
RDW: 14.3 % (ref 12.3–15.4)
WBC: 7 10*3/uL (ref 3.4–10.8)

## 2017-09-13 ENCOUNTER — Ambulatory Visit (INDEPENDENT_AMBULATORY_CARE_PROVIDER_SITE_OTHER): Payer: 59 | Admitting: Specialist

## 2017-09-21 DIAGNOSIS — M722 Plantar fascial fibromatosis: Secondary | ICD-10-CM | POA: Diagnosis not present

## 2017-09-21 DIAGNOSIS — M79672 Pain in left foot: Secondary | ICD-10-CM | POA: Diagnosis not present

## 2017-10-12 DIAGNOSIS — M722 Plantar fascial fibromatosis: Secondary | ICD-10-CM | POA: Diagnosis not present

## 2017-10-12 DIAGNOSIS — M79672 Pain in left foot: Secondary | ICD-10-CM | POA: Diagnosis not present

## 2017-10-17 DIAGNOSIS — G5602 Carpal tunnel syndrome, left upper limb: Secondary | ICD-10-CM | POA: Insufficient documentation

## 2017-10-17 DIAGNOSIS — M79642 Pain in left hand: Secondary | ICD-10-CM | POA: Insufficient documentation

## 2017-10-17 DIAGNOSIS — G5622 Lesion of ulnar nerve, left upper limb: Secondary | ICD-10-CM | POA: Insufficient documentation

## 2017-10-30 ENCOUNTER — Other Ambulatory Visit: Payer: Self-pay | Admitting: Nurse Practitioner

## 2017-10-30 DIAGNOSIS — I10 Essential (primary) hypertension: Secondary | ICD-10-CM

## 2017-11-06 DIAGNOSIS — G5602 Carpal tunnel syndrome, left upper limb: Secondary | ICD-10-CM | POA: Diagnosis not present

## 2017-11-06 DIAGNOSIS — G5622 Lesion of ulnar nerve, left upper limb: Secondary | ICD-10-CM | POA: Diagnosis not present

## 2017-11-14 ENCOUNTER — Other Ambulatory Visit: Payer: Self-pay | Admitting: Nurse Practitioner

## 2017-11-14 DIAGNOSIS — I10 Essential (primary) hypertension: Secondary | ICD-10-CM

## 2017-11-14 MED ORDER — LISINOPRIL 20 MG PO TABS: 20.0000 mg | ORAL_TABLET | Freq: Every day | ORAL | 1 refills | Status: DC

## 2017-11-14 NOTE — Telephone Encounter (Signed)
Refill sent. Patient aware.  

## 2017-12-11 ENCOUNTER — Ambulatory Visit: Payer: BLUE CROSS/BLUE SHIELD | Admitting: Family Medicine

## 2017-12-11 ENCOUNTER — Ambulatory Visit (HOSPITAL_COMMUNITY)
Admission: RE | Admit: 2017-12-11 | Discharge: 2017-12-11 | Disposition: A | Discharge status: Home / Self Care | Payer: BLUE CROSS/BLUE SHIELD | Source: Ambulatory Visit | Attending: Family Medicine | Admitting: Family Medicine

## 2017-12-11 ENCOUNTER — Telehealth: Payer: Self-pay | Admitting: Family Medicine

## 2017-12-11 ENCOUNTER — Encounter: Payer: Self-pay | Admitting: Family Medicine

## 2017-12-11 VITALS — BP 111/74 | HR 100 | Temp 101.9°F | Ht 74.0 in | Wt 243.0 lb

## 2017-12-11 DIAGNOSIS — R1110 Vomiting, unspecified: Secondary | ICD-10-CM | POA: Diagnosis not present

## 2017-12-11 DIAGNOSIS — R1030 Lower abdominal pain, unspecified: Secondary | ICD-10-CM | POA: Diagnosis not present

## 2017-12-11 DIAGNOSIS — R1031 Right lower quadrant pain: Secondary | ICD-10-CM

## 2017-12-11 DIAGNOSIS — K5792 Diverticulitis of intestine, part unspecified, without perforation or abscess without bleeding: Secondary | ICD-10-CM | POA: Diagnosis not present

## 2017-12-11 DIAGNOSIS — R109 Unspecified abdominal pain: Secondary | ICD-10-CM | POA: Diagnosis not present

## 2017-12-11 DIAGNOSIS — I700 Atherosclerosis of aorta: Secondary | ICD-10-CM | POA: Diagnosis not present

## 2017-12-11 DIAGNOSIS — R112 Nausea with vomiting, unspecified: Secondary | ICD-10-CM | POA: Diagnosis not present

## 2017-12-11 LAB — MICROSCOPIC EXAMINATION
Bacteria, UA: NONE SEEN
EPITHELIAL CELLS (NON RENAL): NONE SEEN /HPF (ref 0–10)
RENAL EPITHEL UA: NONE SEEN /HPF

## 2017-12-11 LAB — URINALYSIS, COMPLETE
Bilirubin, UA: NEGATIVE
Glucose, UA: NEGATIVE
Leukocytes, UA: NEGATIVE
NITRITE UA: NEGATIVE
PH UA: 5.5 (ref 5.0–7.5)
Specific Gravity, UA: 1.02 (ref 1.005–1.030)
UUROB: 1 mg/dL (ref 0.2–1.0)

## 2017-12-11 MED ORDER — IOPAMIDOL (ISOVUE-300) INJECTION 61%
100.0000 mL | Freq: Once | INTRAVENOUS | Status: AC | PRN
  Administered 2017-12-11: 100 mL via INTRAVENOUS

## 2017-12-11 MED ORDER — CIPROFLOXACIN HCL 500 MG PO TABS: 500.0000 mg | ORAL_TABLET | Freq: Two times a day (BID) | ORAL | 0 refills | Status: DC

## 2017-12-11 MED ORDER — METRONIDAZOLE 500 MG PO TABS: 500.0000 mg | ORAL_TABLET | Freq: Two times a day (BID) | ORAL | 0 refills | Status: DC

## 2017-12-11 NOTE — Telephone Encounter (Signed)
CT scan showed diverticulitis, sent in to antibiotics Cipro and Flagyl

## 2017-12-11 NOTE — Progress Notes (Signed)
BP 111/74   Pulse 100   Temp (!) 101.9 F (38.8 C) (Oral)   Ht 6\' 2"  (1.88 m)   Wt 243 lb (110.2 kg)   BMI 31.20 kg/m    Subjective:    Patient ID: Allen Herring, male    DOB: Feb 17, 1961, 57 y.o.   MRN: [id number]  HPI: Allen Herring is a 57 y.o. male presenting on 12/11/2017 for Pain in lower abdomen, chills, fever (lower middle part of abdomen; history of diverticulitis)   HPI Right lower quadrant abdominal pain Patient comes in with complaints of right lower quadrant abdominal pain that has been going on since yesterday.  He said he started having some nausea and diarrhea yesterday and the pain and then today he did have some vomiting this morning and also the pain.  He rates the pain as 8 or 9 out of 10 and he did take a hydrocodone he had leftover this morning which did help but that is wearing off now.  He denies any blood in his stool or blood in his vomit.  He did think it possibly could be something with a kidney stone but he is also had a fever.  His temperature here in the office today is 101.9.  Relevant past medical, surgical, family and social history reviewed and updated as indicated. Interim medical history since our last visit reviewed. Allergies and medications reviewed and updated.  Review of Systems  Constitutional: Negative for chills and fever.  Respiratory: Negative for shortness of breath and wheezing.   Cardiovascular: Negative for chest pain and leg swelling.  Gastrointestinal: Positive for abdominal pain, diarrhea, nausea and vomiting. Negative for blood in stool and constipation.  Musculoskeletal: Negative for back pain and gait problem.  Skin: Negative for rash.  All other systems reviewed and are negative.   Per HPI unless specifically indicated above   Allergies as of 12/11/2017   No Known Allergies     Medication List        Accurate as of 12/11/17  2:29 PM. Always use your most recent med list.          fluticasone 50 MCG/ACT nasal  spray Commonly known as:  FLONASE Place 2 sprays into both nostrils daily.   lisinopril 20 MG tablet Commonly known as:  PRINIVIL,ZESTRIL Take 1 tablet (20 mg total) by mouth daily.          Objective:    BP 111/74   Pulse 100   Temp (!) 101.9 F (38.8 C) (Oral)   Ht 6\' 2"  (1.88 m)   Wt 243 lb (110.2 kg)   BMI 31.20 kg/m   Wt Readings from Last 3 Encounters:  12/11/17 243 lb (110.2 kg)  08/07/17 241 lb 9.6 oz (109.6 kg)  10/18/16 241 lb 12.8 oz (109.7 kg)    Physical Exam  Constitutional: He is oriented to person, place, and time. He appears well-developed and well-nourished. No distress.  Eyes: Conjunctivae are normal. No scleral icterus.  Cardiovascular: Normal rate, regular rhythm, normal heart sounds and intact distal pulses.  No murmur heard. Pulmonary/Chest: Effort normal and breath sounds normal. No respiratory distress. He has no wheezes.  Abdominal: Soft. Bowel sounds are normal. He exhibits no distension and no mass. There is tenderness. There is no rebound and no guarding.  Neurological: He is alert and oriented to person, place, and time. Coordination normal.  Skin: Skin is warm and dry. No rash noted. He is not diaphoretic.  Psychiatric: He has a normal  mood and affect. His behavior is normal.  Nursing note and vitals reviewed.   Urinalysis: 11-30 WBCs, 0-2 RBCs, few bacteria, present mucus, 1+ protein, otherwise negative    Assessment & Plan:   Problem List Items Addressed This Visit    None    Visit Diagnoses    Lower abdominal pain    -  Primary   Relevant Orders   Urinalysis, Complete   CT Abdomen Pelvis W Contrast   Right lower quadrant abdominal pain       Relevant Orders   CT Abdomen Pelvis W Contrast      Concern for possible appendicitis versus diverticulitis, will send for CT abdomen pelvis. Follow up plan: Return if symptoms worsen or fail to improve.  Counseling provided for all of the vaccine components Orders Placed This  Encounter  Procedures  . CT Abdomen Pelvis W Contrast  . Urinalysis, Complete    Caryl Pina, MD [facility] 12/11/2017, 2:29 PM

## 2017-12-12 NOTE — Telephone Encounter (Signed)
Patient notified during call report from hospital

## 2017-12-22 DIAGNOSIS — M79642 Pain in left hand: Secondary | ICD-10-CM | POA: Diagnosis not present

## 2017-12-22 DIAGNOSIS — R29898 Other symptoms and signs involving the musculoskeletal system: Secondary | ICD-10-CM | POA: Diagnosis not present

## 2017-12-22 DIAGNOSIS — G5602 Carpal tunnel syndrome, left upper limb: Secondary | ICD-10-CM | POA: Diagnosis not present

## 2017-12-22 DIAGNOSIS — M25632 Stiffness of left wrist, not elsewhere classified: Secondary | ICD-10-CM | POA: Diagnosis not present

## 2017-12-27 DIAGNOSIS — G5602 Carpal tunnel syndrome, left upper limb: Secondary | ICD-10-CM | POA: Diagnosis not present

## 2017-12-27 DIAGNOSIS — M25632 Stiffness of left wrist, not elsewhere classified: Secondary | ICD-10-CM | POA: Diagnosis not present

## 2017-12-27 DIAGNOSIS — R29898 Other symptoms and signs involving the musculoskeletal system: Secondary | ICD-10-CM | POA: Diagnosis not present

## 2017-12-27 DIAGNOSIS — M79642 Pain in left hand: Secondary | ICD-10-CM | POA: Diagnosis not present

## 2018-01-03 DIAGNOSIS — G5602 Carpal tunnel syndrome, left upper limb: Secondary | ICD-10-CM | POA: Diagnosis not present

## 2018-01-03 DIAGNOSIS — R29898 Other symptoms and signs involving the musculoskeletal system: Secondary | ICD-10-CM | POA: Diagnosis not present

## 2018-01-03 DIAGNOSIS — M25632 Stiffness of left wrist, not elsewhere classified: Secondary | ICD-10-CM | POA: Diagnosis not present

## 2018-01-03 DIAGNOSIS — M79642 Pain in left hand: Secondary | ICD-10-CM | POA: Diagnosis not present

## 2018-01-11 DIAGNOSIS — G5602 Carpal tunnel syndrome, left upper limb: Secondary | ICD-10-CM | POA: Diagnosis not present

## 2018-01-11 DIAGNOSIS — M25632 Stiffness of left wrist, not elsewhere classified: Secondary | ICD-10-CM | POA: Diagnosis not present

## 2018-01-11 DIAGNOSIS — R29898 Other symptoms and signs involving the musculoskeletal system: Secondary | ICD-10-CM | POA: Diagnosis not present

## 2018-01-11 DIAGNOSIS — M79642 Pain in left hand: Secondary | ICD-10-CM | POA: Diagnosis not present

## 2018-01-15 DIAGNOSIS — M79642 Pain in left hand: Secondary | ICD-10-CM | POA: Diagnosis not present

## 2018-01-15 DIAGNOSIS — G5602 Carpal tunnel syndrome, left upper limb: Secondary | ICD-10-CM | POA: Diagnosis not present

## 2018-01-15 DIAGNOSIS — M25632 Stiffness of left wrist, not elsewhere classified: Secondary | ICD-10-CM | POA: Diagnosis not present

## 2018-01-15 DIAGNOSIS — R29898 Other symptoms and signs involving the musculoskeletal system: Secondary | ICD-10-CM | POA: Diagnosis not present

## 2018-01-17 DIAGNOSIS — G5602 Carpal tunnel syndrome, left upper limb: Secondary | ICD-10-CM | POA: Diagnosis not present

## 2018-01-17 DIAGNOSIS — R29898 Other symptoms and signs involving the musculoskeletal system: Secondary | ICD-10-CM | POA: Diagnosis not present

## 2018-01-17 DIAGNOSIS — M79642 Pain in left hand: Secondary | ICD-10-CM | POA: Diagnosis not present

## 2018-01-17 DIAGNOSIS — M25632 Stiffness of left wrist, not elsewhere classified: Secondary | ICD-10-CM | POA: Diagnosis not present

## 2018-02-02 ENCOUNTER — Telehealth: Payer: Self-pay | Admitting: Nurse Practitioner

## 2018-02-02 DIAGNOSIS — I10 Essential (primary) hypertension: Secondary | ICD-10-CM

## 2018-02-02 MED ORDER — LISINOPRIL 20 MG PO TABS
20.0000 mg | ORAL_TABLET | Freq: Every day | ORAL | 1 refills | Status: DC
Start: 2018-02-02 — End: 2018-02-23

## 2018-02-02 NOTE — Telephone Encounter (Signed)
rx sent to pharmacy

## 2018-02-02 NOTE — Telephone Encounter (Signed)
Patient aware.

## 2018-02-02 NOTE — Telephone Encounter (Signed)
What is the name of the medication? lisinopril (PRINIVIL,ZESTRIL) 20 MG tablet  Have you contacted your pharmacy to request a refill? no  Which pharmacy would you like this sent to? [facility] [place]   Patient notified that their request is being sent to the clinical staff for review and that they should receive a call once it is complete. If they do not receive a call within 24 hours they can check with their pharmacy or our office.    Pt had an appt with MMM on 02/09/18 but she will not be here and we had to reschedule. Pt now has appt for 02/23/18 with MMM. Can bp med be called in till appt date? Please call back

## 2018-02-09 ENCOUNTER — Ambulatory Visit: Payer: 59 | Admitting: Nurse Practitioner

## 2018-02-23 ENCOUNTER — Encounter: Payer: Self-pay | Admitting: Nurse Practitioner

## 2018-02-23 ENCOUNTER — Ambulatory Visit: Payer: BLUE CROSS/BLUE SHIELD | Admitting: Nurse Practitioner

## 2018-02-23 VITALS — BP 130/85 | HR 74 | Temp 97.3°F | Ht 74.0 in | Wt 237.0 lb

## 2018-02-23 DIAGNOSIS — I10 Essential (primary) hypertension: Secondary | ICD-10-CM | POA: Diagnosis not present

## 2018-02-23 DIAGNOSIS — Z125 Encounter for screening for malignant neoplasm of prostate: Secondary | ICD-10-CM

## 2018-02-23 DIAGNOSIS — J301 Allergic rhinitis due to pollen: Secondary | ICD-10-CM

## 2018-02-23 MED ORDER — LISINOPRIL 20 MG PO TABS: 20.0000 mg | ORAL_TABLET | Freq: Every day | ORAL | 1 refills | Status: DC

## 2018-02-23 NOTE — Patient Instructions (Signed)
DASH Eating Plan DASH stands for "Dietary Approaches to Stop Hypertension." The DASH eating plan is a healthy eating plan that has been shown to reduce high blood pressure (hypertension). It may also reduce your risk for type 2 diabetes, heart disease, and stroke. The DASH eating plan may also help with weight loss. What are tips for following this plan? General guidelines  Avoid eating more than 2,300 mg (milligrams) of salt (sodium) a day. If you have hypertension, you may need to reduce your sodium intake to 1,500 mg a day.  Limit alcohol intake to no more than 1 drink a day for nonpregnant women and 2 drinks a day for men. One drink equals 12 oz of beer, 5 oz of wine, or 1 oz of hard liquor.  Work with your health care provider to maintain a healthy body weight or to lose weight. Ask what an ideal weight is for you.  Get at least 30 minutes of exercise that causes your heart to beat faster (aerobic exercise) most days of the week. Activities may include walking, swimming, or biking.  Work with your health care provider or diet and nutrition specialist (dietitian) to adjust your eating plan to your individual calorie needs. Reading food labels  Check food labels for the amount of sodium per serving. Choose foods with less than 5 percent of the Daily Value of sodium. Generally, foods with less than 300 mg of sodium per serving fit into this eating plan.  To find whole grains, look for the word "whole" as the first word in the ingredient list. Shopping  Buy products labeled as "low-sodium" or "no salt added."  Buy fresh foods. Avoid canned foods and premade or frozen meals. Cooking  Avoid adding salt when cooking. Use salt-free seasonings or herbs instead of table salt or sea salt. Check with your health care provider or pharmacist before using salt substitutes.  Do not fry foods. Cook foods using healthy methods such as baking, boiling, grilling, and broiling instead.  Cook with  heart-healthy oils, such as olive, canola, soybean, or sunflower oil. Meal planning   Eat a balanced diet that includes: ? 5 or more servings of fruits and vegetables each day. At each meal, try to fill half of your plate with fruits and vegetables. ? Up to 6-8 servings of whole grains each day. ? Less than 6 oz of lean meat, poultry, or fish each day. A 3-oz serving of meat is about the same size as a deck of cards. One egg equals 1 oz. ? 2 servings of low-fat dairy each day. ? A serving of nuts, seeds, or beans 5 times each week. ? Heart-healthy fats. Healthy fats called Omega-3 fatty acids are found in foods such as flaxseeds and coldwater fish, like sardines, salmon, and mackerel.  Limit how much you eat of the following: ? Canned or prepackaged foods. ? Food that is high in trans fat, such as fried foods. ? Food that is high in saturated fat, such as fatty meat. ? Sweets, desserts, sugary drinks, and other foods with added sugar. ? Full-fat dairy products.  Do not salt foods before eating.  Try to eat at least 2 vegetarian meals each week.  Eat more home-cooked food and less restaurant, buffet, and fast food.  When eating at a restaurant, ask that your food be prepared with less salt or no salt, if possible. What foods are recommended? The items listed may not be a complete list. Talk with your dietitian about what   dietary choices are best for you. Grains Whole-grain or whole-wheat bread. Whole-grain or whole-wheat pasta. Brown rice. Oatmeal. Quinoa. Bulgur. Whole-grain and low-sodium cereals. Pita bread. Low-fat, low-sodium crackers. Whole-wheat flour tortillas. Vegetables Fresh or frozen vegetables (raw, steamed, roasted, or grilled). Low-sodium or reduced-sodium tomato and vegetable juice. Low-sodium or reduced-sodium tomato sauce and tomato paste. Low-sodium or reduced-sodium canned vegetables. Fruits All fresh, dried, or frozen fruit. Canned fruit in natural juice (without  added sugar). Meat and other protein foods Skinless chicken or turkey. Ground chicken or turkey. Pork with fat trimmed off. Fish and seafood. Egg whites. Dried beans, peas, or lentils. Unsalted nuts, nut butters, and seeds. Unsalted canned beans. Lean cuts of beef with fat trimmed off. Low-sodium, lean deli meat. Dairy Low-fat (1%) or fat-free (skim) milk. Fat-free, low-fat, or reduced-fat cheeses. Nonfat, low-sodium ricotta or cottage cheese. Low-fat or nonfat yogurt. Low-fat, low-sodium cheese. Fats and oils Soft margarine without trans fats. Vegetable oil. Low-fat, reduced-fat, or light mayonnaise and salad dressings (reduced-sodium). Canola, safflower, olive, soybean, and sunflower oils. Avocado. Seasoning and other foods Herbs. Spices. Seasoning mixes without salt. Unsalted popcorn and pretzels. Fat-free sweets. What foods are not recommended? The items listed may not be a complete list. Talk with your dietitian about what dietary choices are best for you. Grains Baked goods made with fat, such as croissants, muffins, or some breads. Dry pasta or rice meal packs. Vegetables Creamed or fried vegetables. Vegetables in a cheese sauce. Regular canned vegetables (not low-sodium or reduced-sodium). Regular canned tomato sauce and paste (not low-sodium or reduced-sodium). Regular tomato and vegetable juice (not low-sodium or reduced-sodium). Pickles. Olives. Fruits Canned fruit in a light or heavy syrup. Fried fruit. Fruit in cream or butter sauce. Meat and other protein foods Fatty cuts of meat. Ribs. Fried meat. Bacon. Sausage. Bologna and other processed lunch meats. Salami. Fatback. Hotdogs. Bratwurst. Salted nuts and seeds. Canned beans with added salt. Canned or smoked fish. Whole eggs or egg yolks. Chicken or turkey with skin. Dairy Whole or 2% milk, cream, and half-and-half. Whole or full-fat cream cheese. Whole-fat or sweetened yogurt. Full-fat cheese. Nondairy creamers. Whipped toppings.  Processed cheese and cheese spreads. Fats and oils Butter. Stick margarine. Lard. Shortening. Ghee. Bacon fat. Tropical oils, such as coconut, palm kernel, or palm oil. Seasoning and other foods Salted popcorn and pretzels. Onion salt, garlic salt, seasoned salt, table salt, and sea salt. Worcestershire sauce. Tartar sauce. Barbecue sauce. Teriyaki sauce. Soy sauce, including reduced-sodium. Steak sauce. Canned and packaged gravies. Fish sauce. Oyster sauce. Cocktail sauce. Horseradish that you find on the shelf. Ketchup. Mustard. Meat flavorings and tenderizers. Bouillon cubes. Hot sauce and Tabasco sauce. Premade or packaged marinades. Premade or packaged taco seasonings. Relishes. Regular salad dressings. Where to find more information:  National Heart, Lung, and Blood Institute: www.nhlbi.nih.gov  American Heart Association: www.heart.org Summary  The DASH eating plan is a healthy eating plan that has been shown to reduce high blood pressure (hypertension). It may also reduce your risk for type 2 diabetes, heart disease, and stroke.  With the DASH eating plan, you should limit salt (sodium) intake to 2,300 mg a day. If you have hypertension, you may need to reduce your sodium intake to 1,500 mg a day.  When on the DASH eating plan, aim to eat more fresh fruits and vegetables, whole grains, lean proteins, low-fat dairy, and heart-healthy fats.  Work with your health care provider or diet and nutrition specialist (dietitian) to adjust your eating plan to your individual   calorie needs. This information is not intended to replace advice given to you by your health care provider. Make sure you discuss any questions you have with your health care provider. Document Released: 05/12/2011 Document Revised: 05/16/2016 Document Reviewed: 05/16/2016 Elsevier Interactive Patient Education  2018 Elsevier Inc.  

## 2018-02-23 NOTE — Progress Notes (Signed)
   Subjective:    Patient ID: Allen Herring, male    DOB: 1960/08/03, 57 y.o.   MRN: [id number]   Chief Complaint: medical management of chronic issues  HPI:  1. Essential hypertension  No c/o chest pain, sob or headache. Does not check blood pressure at  Home BP Readings from Last 3 Encounters:  02/23/18 130/85  12/11/17 111/74  08/07/17 (!) 133/93     2. Seasonal allergic rhinitis due to pollen  Uses flonase to keep allergies under control. Works well but does nit use it every day    Outpatient Encounter Medications as of 02/23/2018  Medication Sig  . fluticasone (FLONASE) 50 MCG/ACT nasal spray Place 2 sprays into both nostrils daily.  Marland Kitchen lisinopril (PRINIVIL,ZESTRIL) 20 MG tablet Take 1 tablet (20 mg total) by mouth daily.       New complaints: none today  Social history: Works at [employer] in [employer]. He has to travel a lot.   Review of Systems  Constitutional: Negative for activity change and appetite change.  HENT: Negative.   Eyes: Negative for pain.  Respiratory: Negative for shortness of breath.   Cardiovascular: Negative for chest pain, palpitations and leg swelling.  Gastrointestinal: Negative for abdominal pain.  Endocrine: Negative for polydipsia.  Genitourinary: Negative.   Skin: Negative for rash.  Neurological: Negative for dizziness, weakness and headaches.  Hematological: Does not bruise/bleed easily.  Psychiatric/Behavioral: Negative.   All other systems reviewed and are negative.      Objective:   Physical Exam  Constitutional: He is oriented to person, place, and time. He appears well-developed and well-nourished.  HENT:  Head: Normocephalic.  Nose: Nose normal.  Mouth/Throat: Oropharynx is clear and moist.  Eyes: Pupils are equal, round, and reactive to light. EOM are normal.  Neck: Normal range of motion and phonation normal. Neck supple. No JVD present. Carotid bruit is not present. No thyroid mass and no thyromegaly  present.  Cardiovascular: Normal rate and regular rhythm.  Pulmonary/Chest: Effort normal and breath sounds normal. No respiratory distress.  Abdominal: Soft. Normal appearance, normal aorta and bowel sounds are normal. There is no tenderness.  Musculoskeletal: Normal range of motion.  Lymphadenopathy:    He has no cervical adenopathy.  Neurological: He is alert and oriented to person, place, and time.  Skin: Skin is warm and dry.  Psychiatric: He has a normal mood and affect. His behavior is normal. Judgment and thought content normal.  Nursing note and vitals reviewed.  BP 130/85   Pulse 74   Temp (!) 97.3 F (36.3 C) (Oral)   Ht '6\' 2"'$  (1.88 m)   Wt 237 lb (107.5 kg)   BMI 30.43 kg/m       Assessment & Plan:  Allen Herring comes in today with chief complaint of Medical Management of Chronic Issues   Diagnosis and orders addressed:  1. Essential hypertension Dash diet - CMP14+EGFR - Lipid panel - lisinopril (PRINIVIL,ZESTRIL) 20 MG tablet; Take 1 tablet (20 mg total) by mouth daily.  Dispense: 90 tablet; Refill: 1  2. Seasonal allergic rhinitis due to pollen Use flonase as needed  3. Prostate cancer screening - PSA, total and free   Labs pending Health Maintenance reviewed Diet and exercise encouraged  Follow up plan: 6 months   [place], FNP

## 2018-02-24 LAB — CMP14+EGFR
ALBUMIN: 4.4 g/dL (ref 3.5–5.5)
ALK PHOS: 57 IU/L (ref 39–117)
ALT: 19 IU/L (ref 0–44)
AST: 20 IU/L (ref 0–40)
Albumin/Globulin Ratio: 1.6 (ref 1.2–2.2)
BUN / CREAT RATIO: 15 (ref 9–20)
BUN: 14 mg/dL (ref 6–24)
Bilirubin Total: 0.5 mg/dL (ref 0.0–1.2)
CALCIUM: 9.4 mg/dL (ref 8.7–10.2)
CO2: 24 mmol/L (ref 20–29)
Chloride: 103 mmol/L (ref 96–106)
Creatinine, Ser: 0.96 mg/dL (ref 0.76–1.27)
GFR calc Af Amer: 100 mL/min/{1.73_m2} (ref >59)
GFR, EST NON AFRICAN AMERICAN: 87 mL/min/{1.73_m2} (ref >59)
GLUCOSE: 86 mg/dL (ref 65–99)
Globulin, Total: 2.8 g/dL (ref 1.5–4.5)
Potassium: 4.2 mmol/L (ref 3.5–5.2)
Sodium: 141 mmol/L (ref 134–144)
TOTAL PROTEIN: 7.2 g/dL (ref 6.0–8.5)

## 2018-02-24 LAB — LIPID PANEL
CHOLESTEROL TOTAL: 160 mg/dL (ref 100–199)
Chol/HDL Ratio: 5.2 ratio — ABNORMAL HIGH (ref 0.0–5.0)
HDL: 31 mg/dL — ABNORMAL LOW (ref >39)
LDL CALC: 104 mg/dL — AB (ref 0–99)
Triglycerides: 123 mg/dL (ref 0–149)
VLDL Cholesterol Cal: 25 mg/dL (ref 5–40)

## 2018-02-24 LAB — PSA, TOTAL AND FREE
PSA FREE: 0.23 ng/mL
PSA, Free Pct: 28.8 %
Prostate Specific Ag, Serum: 0.8 ng/mL (ref 0.0–4.0)

## 2018-03-08 DIAGNOSIS — G5602 Carpal tunnel syndrome, left upper limb: Secondary | ICD-10-CM | POA: Diagnosis not present

## 2018-03-08 DIAGNOSIS — G5622 Lesion of ulnar nerve, left upper limb: Secondary | ICD-10-CM | POA: Diagnosis not present

## 2018-05-10 DIAGNOSIS — G5602 Carpal tunnel syndrome, left upper limb: Secondary | ICD-10-CM | POA: Diagnosis not present

## 2018-05-10 DIAGNOSIS — G5622 Lesion of ulnar nerve, left upper limb: Secondary | ICD-10-CM | POA: Diagnosis not present

## 2018-08-29 ENCOUNTER — Other Ambulatory Visit: Payer: Self-pay

## 2018-08-29 ENCOUNTER — Telehealth (INDEPENDENT_AMBULATORY_CARE_PROVIDER_SITE_OTHER): Payer: BLUE CROSS/BLUE SHIELD | Admitting: Nurse Practitioner

## 2018-08-29 DIAGNOSIS — Z6830 Body mass index (BMI) 30.0-30.9, adult: Secondary | ICD-10-CM | POA: Diagnosis not present

## 2018-08-29 DIAGNOSIS — Z1212 Encounter for screening for malignant neoplasm of rectum: Secondary | ICD-10-CM

## 2018-08-29 DIAGNOSIS — Z1211 Encounter for screening for malignant neoplasm of colon: Secondary | ICD-10-CM

## 2018-08-29 DIAGNOSIS — I10 Essential (primary) hypertension: Secondary | ICD-10-CM

## 2018-08-29 DIAGNOSIS — J301 Allergic rhinitis due to pollen: Secondary | ICD-10-CM | POA: Diagnosis not present

## 2018-08-29 MED ORDER — LISINOPRIL 20 MG PO TABS: 20.0000 mg | ORAL_TABLET | Freq: Every day | ORAL | 1 refills | Status: DC

## 2018-08-29 NOTE — Patient Instructions (Signed)
DASH Eating Plan  DASH stands for "Dietary Approaches to Stop Hypertension." The DASH eating plan is a healthy eating plan that has been shown to reduce high blood pressure (hypertension). It may also reduce your risk for type 2 diabetes, heart disease, and stroke. The DASH eating plan may also help with weight loss.  What are tips for following this plan?    General guidelines   Avoid eating more than 2,300 mg (milligrams) of salt (sodium) a day. If you have hypertension, you may need to reduce your sodium intake to 1,500 mg a day.   Limit alcohol intake to no more than 1 drink a day for nonpregnant women and 2 drinks a day for men. One drink equals 12 oz of beer, 5 oz of wine, or 1 oz of hard liquor.   Work with your health care provider to maintain a healthy body weight or to lose weight. Ask what an ideal weight is for you.   Get at least 30 minutes of exercise that causes your heart to beat faster (aerobic exercise) most days of the week. Activities may include walking, swimming, or biking.   Work with your health care provider or diet and nutrition specialist (dietitian) to adjust your eating plan to your individual calorie needs.  Reading food labels     Check food labels for the amount of sodium per serving. Choose foods with less than 5 percent of the Daily Value of sodium. Generally, foods with less than 300 mg of sodium per serving fit into this eating plan.   To find whole grains, look for the word "whole" as the first word in the ingredient list.  Shopping   Buy products labeled as "low-sodium" or "no salt added."   Buy fresh foods. Avoid canned foods and premade or frozen meals.  Cooking   Avoid adding salt when cooking. Use salt-free seasonings or herbs instead of table salt or sea salt. Check with your health care provider or pharmacist before using salt substitutes.   Do not fry foods. Cook foods using healthy methods such as baking, boiling, grilling, and broiling instead.   Cook with  heart-healthy oils, such as olive, canola, soybean, or sunflower oil.  Meal planning   Eat a balanced diet that includes:  ? 5 or more servings of fruits and vegetables each day. At each meal, try to fill half of your plate with fruits and vegetables.  ? Up to 6-8 servings of whole grains each day.  ? Less than 6 oz of lean meat, poultry, or fish each day. A 3-oz serving of meat is about the same size as a deck of cards. One egg equals 1 oz.  ? 2 servings of low-fat dairy each day.  ? A serving of nuts, seeds, or beans 5 times each week.  ? Heart-healthy fats. Healthy fats called Omega-3 fatty acids are found in foods such as flaxseeds and coldwater fish, like sardines, salmon, and mackerel.   Limit how much you eat of the following:  ? Canned or prepackaged foods.  ? Food that is high in trans fat, such as fried foods.  ? Food that is high in saturated fat, such as fatty meat.  ? Sweets, desserts, sugary drinks, and other foods with added sugar.  ? Full-fat dairy products.   Do not salt foods before eating.   Try to eat at least 2 vegetarian meals each week.   Eat more home-cooked food and less restaurant, buffet, and fast food.     When eating at a restaurant, ask that your food be prepared with less salt or no salt, if possible.  What foods are recommended?  The items listed may not be a complete list. Talk with your dietitian about what dietary choices are best for you.  Grains  Whole-grain or whole-wheat bread. Whole-grain or whole-wheat pasta. Brown rice. Oatmeal. Quinoa. Bulgur. Whole-grain and low-sodium cereals. Pita bread. Low-fat, low-sodium crackers. Whole-wheat flour tortillas.  Vegetables  Fresh or frozen vegetables (raw, steamed, roasted, or grilled). Low-sodium or reduced-sodium tomato and vegetable juice. Low-sodium or reduced-sodium tomato sauce and tomato paste. Low-sodium or reduced-sodium canned vegetables.  Fruits  All fresh, dried, or frozen fruit. Canned fruit in natural juice (without  added sugar).  Meat and other protein foods  Skinless chicken or turkey. Ground chicken or turkey. Pork with fat trimmed off. Fish and seafood. Egg whites. Dried beans, peas, or lentils. Unsalted nuts, nut butters, and seeds. Unsalted canned beans. Lean cuts of beef with fat trimmed off. Low-sodium, lean deli meat.  Dairy  Low-fat (1%) or fat-free (skim) milk. Fat-free, low-fat, or reduced-fat cheeses. Nonfat, low-sodium ricotta or cottage cheese. Low-fat or nonfat yogurt. Low-fat, low-sodium cheese.  Fats and oils  Soft margarine without trans fats. Vegetable oil. Low-fat, reduced-fat, or light mayonnaise and salad dressings (reduced-sodium). Canola, safflower, olive, soybean, and sunflower oils. Avocado.  Seasoning and other foods  Herbs. Spices. Seasoning mixes without salt. Unsalted popcorn and pretzels. Fat-free sweets.  What foods are not recommended?  The items listed may not be a complete list. Talk with your dietitian about what dietary choices are best for you.  Grains  Baked goods made with fat, such as croissants, muffins, or some breads. Dry pasta or rice meal packs.  Vegetables  Creamed or fried vegetables. Vegetables in a cheese sauce. Regular canned vegetables (not low-sodium or reduced-sodium). Regular canned tomato sauce and paste (not low-sodium or reduced-sodium). Regular tomato and vegetable juice (not low-sodium or reduced-sodium). Pickles. Olives.  Fruits  Canned fruit in a light or heavy syrup. Fried fruit. Fruit in cream or butter sauce.  Meat and other protein foods  Fatty cuts of meat. Ribs. Fried meat. Bacon. Sausage. Bologna and other processed lunch meats. Salami. Fatback. Hotdogs. Bratwurst. Salted nuts and seeds. Canned beans with added salt. Canned or smoked fish. Whole eggs or egg yolks. Chicken or turkey with skin.  Dairy  Whole or 2% milk, cream, and half-and-half. Whole or full-fat cream cheese. Whole-fat or sweetened yogurt. Full-fat cheese. Nondairy creamers. Whipped toppings.  Processed cheese and cheese spreads.  Fats and oils  Butter. Stick margarine. Lard. Shortening. Ghee. Bacon fat. Tropical oils, such as coconut, palm kernel, or palm oil.  Seasoning and other foods  Salted popcorn and pretzels. Onion salt, garlic salt, seasoned salt, table salt, and sea salt. Worcestershire sauce. Tartar sauce. Barbecue sauce. Teriyaki sauce. Soy sauce, including reduced-sodium. Steak sauce. Canned and packaged gravies. Fish sauce. Oyster sauce. Cocktail sauce. Horseradish that you find on the shelf. Ketchup. Mustard. Meat flavorings and tenderizers. Bouillon cubes. Hot sauce and Tabasco sauce. Premade or packaged marinades. Premade or packaged taco seasonings. Relishes. Regular salad dressings.  Where to find more information:   National Heart, Lung, and Blood Institute: www.nhlbi.nih.gov   American Heart Association: www.heart.org  Summary   The DASH eating plan is a healthy eating plan that has been shown to reduce high blood pressure (hypertension). It may also reduce your risk for type 2 diabetes, heart disease, and stroke.   With the   DASH eating plan, you should limit salt (sodium) intake to 2,300 mg a day. If you have hypertension, you may need to reduce your sodium intake to 1,500 mg a day.   When on the DASH eating plan, aim to eat more fresh fruits and vegetables, whole grains, lean proteins, low-fat dairy, and heart-healthy fats.   Work with your health care provider or diet and nutrition specialist (dietitian) to adjust your eating plan to your individual calorie needs.  This information is not intended to replace advice given to you by your health care provider. Make sure you discuss any questions you have with your health care provider.  Document Released: 05/12/2011 Document Revised: 05/16/2016 Document Reviewed: 05/16/2016  Elsevier Interactive Patient Education  2019 Elsevier Inc.

## 2018-08-29 NOTE — Progress Notes (Signed)
   Virtual Visit via telephone Note  I connected with Allen Herring on 08/29/18 at 2:15PM by telephone and verified that I am speaking with the correct person using two identifiers. Allen Herring is currently located at home and girlfriend is currently with her during visit. The provider, Mary-Margaret Hassell Done, FNP is located in their office at time of visit.  I discussed the limitations, risks, security and privacy concerns of performing an evaluation and management service by telephone and the availability of in person appointments. I also discussed with the patient that there may be a patient responsible charge related to this service. The patient expressed understanding and agreed to proceed.   History and Present Illness:   Chief Complaint: medical management of chronic issue  HPI:  1. Essential hypertension  No c/o chest pain, sob or headache. Does not check blood pressure at home. BP Readings from Last 3 Encounters:  02/23/18 130/85  12/11/17 111/74  08/07/17 (!) 133/93     2. Seasonal allergic rhinitis due to pollen  Not having any problems right  Now. Uses flonase in spring.  3. BMI 30.0-30.9,adult  No weight changes.    Outpatient Encounter Medications as of 08/29/2018  Medication Sig  . fluticasone (FLONASE) 50 MCG/ACT nasal spray Place 2 sprays into both nostrils daily.  Marland Kitchen lisinopril (PRINIVIL,ZESTRIL) 20 MG tablet Take 1 tablet (20 mg total) by mouth daily.      New complaints: No complaints  Social history: Working from home due to corona virus     History obtained from the patient General ROS: negative Psychological ROS: negative for - anxiety or depression Respiratory ROS: no cough, shortness of breath, or wheezing Cardiovascular ROS: no chest pain or dyspnea on exertion Gastrointestinal ROS: no abdominal pain, change in bowel habits, or black or bloody stools Genito-Urinary ROS: no dysuria, trouble voiding, or hematuria Neurological ROS: no TIA  or stroke symptoms   Observations/Objective: Alert and oriented- answers all questions appropriately  Assessment and Plan: Allen Herring comes in today with chief complaint of No chief complaint on file.   Diagnosis and orders addressed:  1. Essential hypertension Low sodium diet - lisinopril (PRINIVIL,ZESTRIL) 20 MG tablet; Take 1 tablet (20 mg total) by mouth daily.  Dispense: 90 tablet; Refill: 1  2. Seasonal allergic rhinitis due to pollen Use flonase when needed  3. BMI 30.0-30.9,adult Discussed diet and exercise for person with BMI >25 Will recheck weight in 3-6 months  4. Encounter for colorectal cancer screening - Cologuard   Labs pending Health Maintenance reviewed Diet and exercise encouraged      Follow Up Instructions: 3 months    I discussed the assessment and treatment plan with the patient. The patient was provided an opportunity to ask questions and all were answered. The patient agreed with the plan and demonstrated an understanding of the instructions.   The patient was advised to call back or seek an in-person evaluation if the symptoms worsen or if the condition fails to improve as anticipated.  The above assessment and management plan was discussed with the patient. The patient verbalized understanding of and has agreed to the management plan. Patient is aware to call the clinic if symptoms persist or worsen. Patient is aware when to return to the clinic for a follow-up visit. Patient educated on when it is appropriate to go to the emergency department.    I provided 12 minutes of non-face-to-face time during this encounter.    Mary-Margaret Hassell Done, FNP

## 2018-12-29 IMAGING — CT CT ABD-PELV W/ CM
2 of 5 series · 15 of 46 positions shown, 17 images · IV contrast (Isovue)
Comparison: No priors.

CLINICAL DATA: 56-year-old male with history of right lower
quadrant and mid lower abdominal pain with nausea and vomiting today
and for the past 24 hours.

EXAM:
CT ABDOMEN AND PELVIS WITH CONTRAST
TECHNIQUE: Multidetector CT imaging of the abdomen and pelvis was performed
using the standard protocol following bolus administration of
intravenous contrast.
CONTRAST:  100mL SZFOYI-BYY IOPAMIDOL (SZFOYI-BYY) INJECTION 61%

[Series 2: axial st · axial · 0.86mm/px · z∈[-722,-247]mm · 12 of 109 slices shown, 14 images]
[im 7/109  soft-tissue]
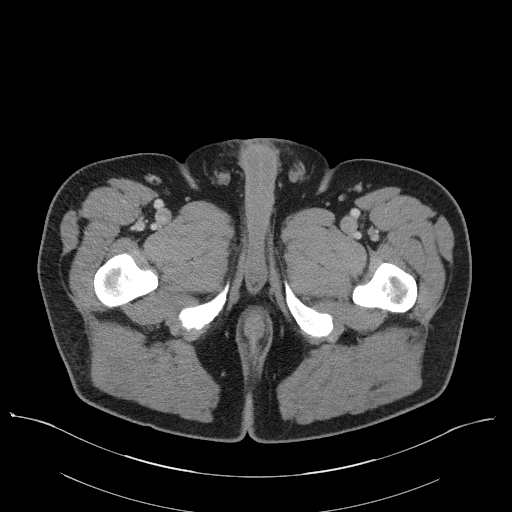
[im 7/109  bone]
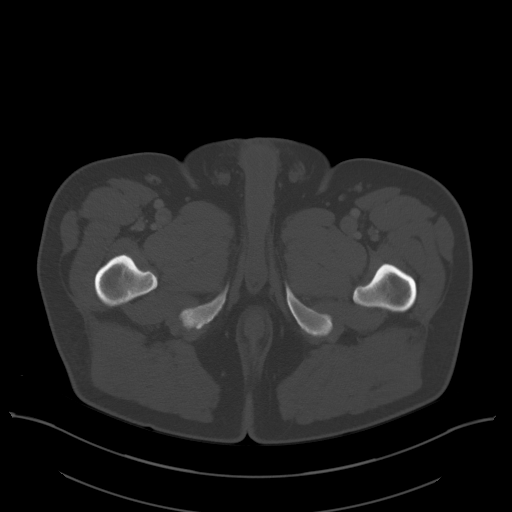
[im 14/109  soft-tissue]
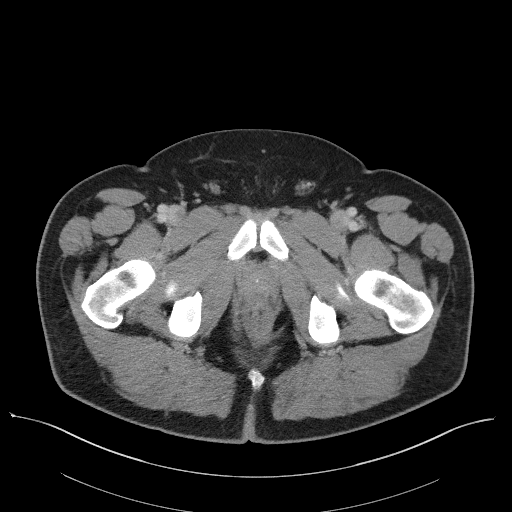
[im 28/109  soft-tissue]
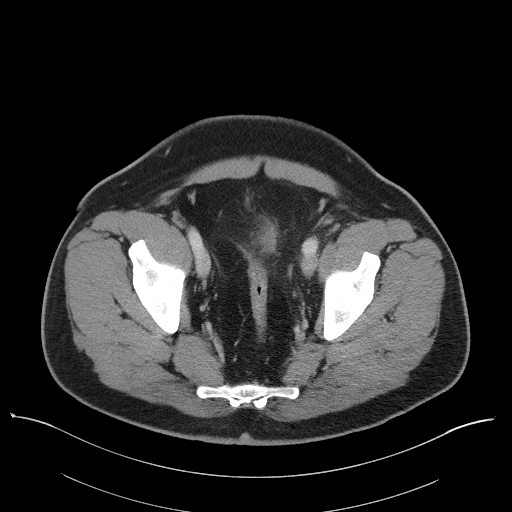
[im 34/109  soft-tissue]
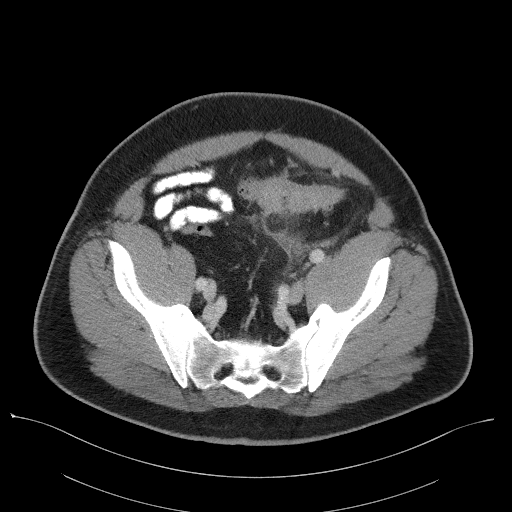
[im 41/109  soft-tissue]
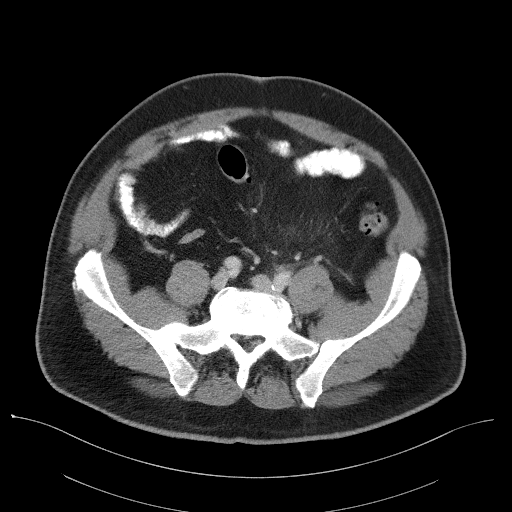
[im 48/109  soft-tissue]
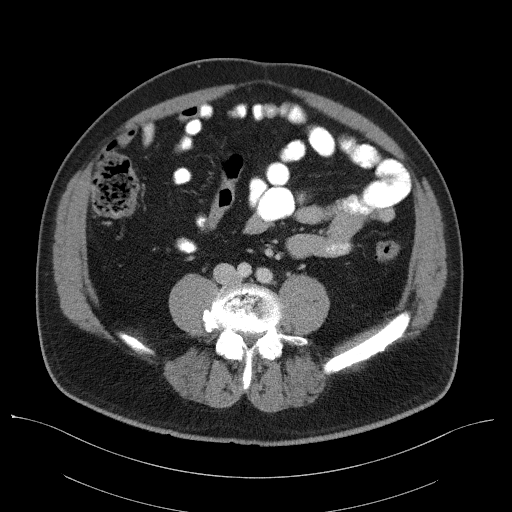
[im 61/109  soft-tissue]
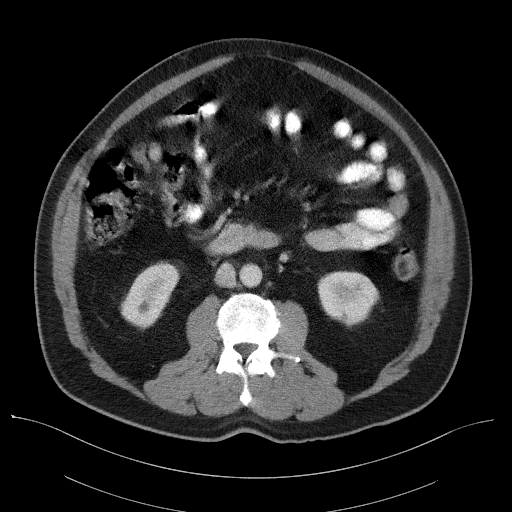
[im 68/109  soft-tissue]
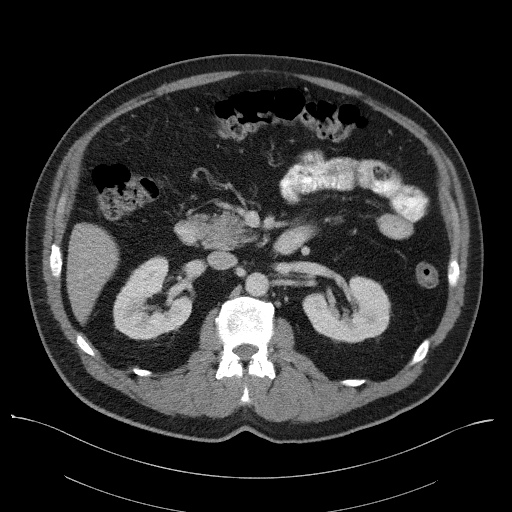
[im 75/109  soft-tissue]
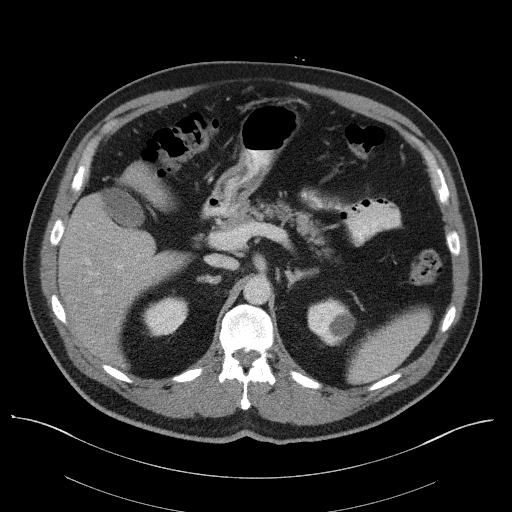
[im 75/109  bone]
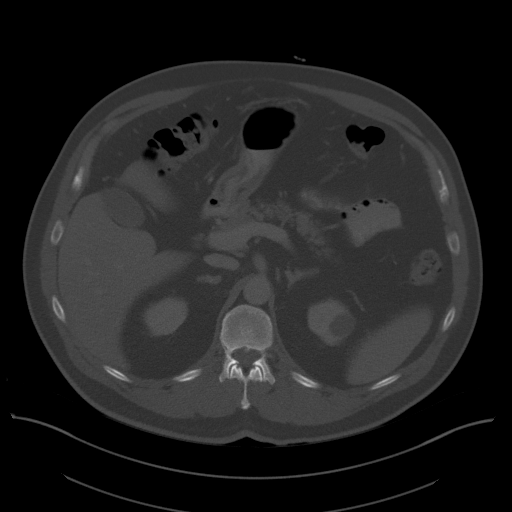
[im 82/109  soft-tissue]
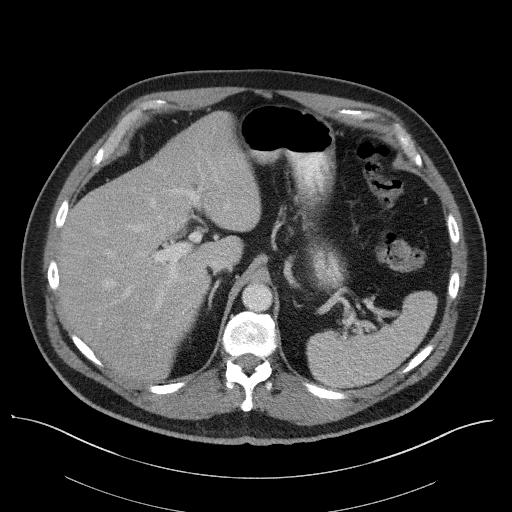
[im 95/109  soft-tissue]
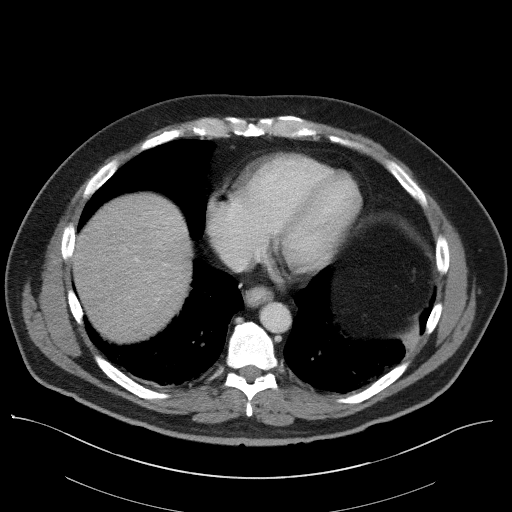
[im 102/109  soft-tissue]
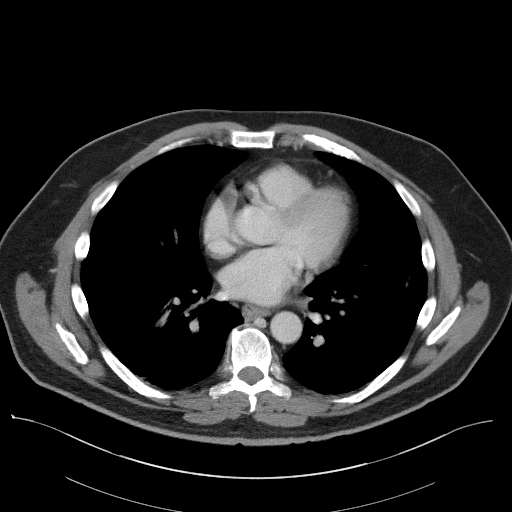

[Series 4: coronal st · coronal · 0.78mm/px · 3 of 113 slices shown]
[im 38/113  soft-tissue]
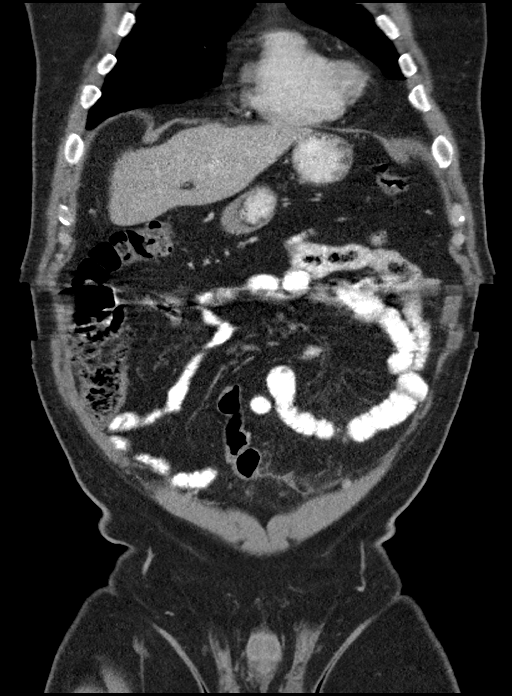
[im 50/113  soft-tissue]
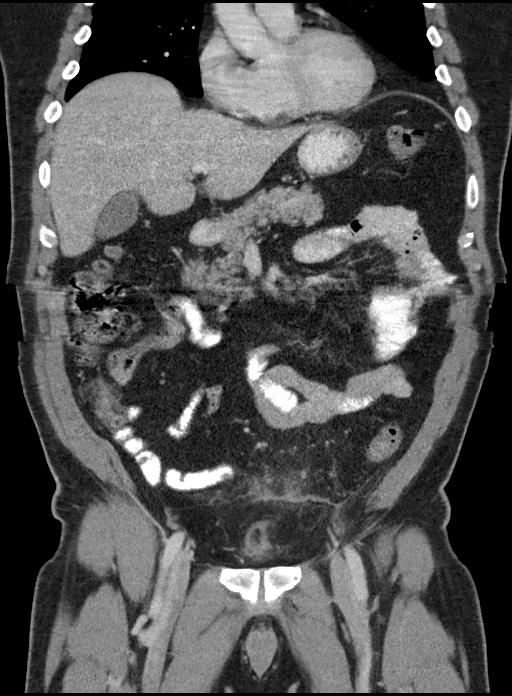
[im 63/113  soft-tissue]
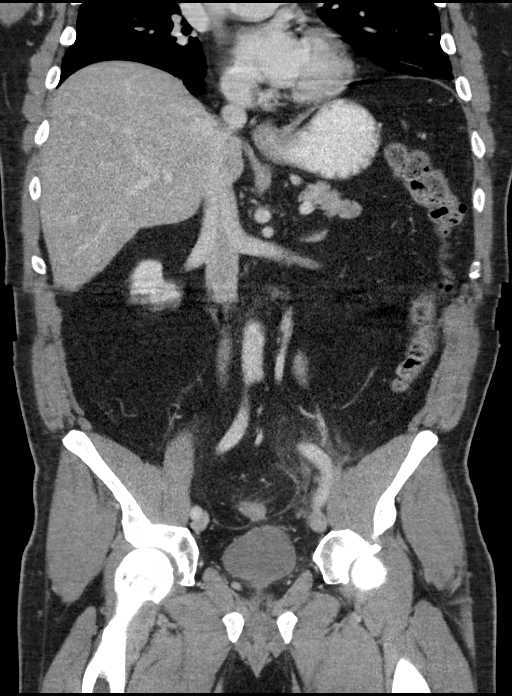

[15 of 46 positions shown; findings below may reference images not displayed]

FINDINGS: Lower chest: Linear areas of scarring and/or subsegmental
atelectasis in the lung bases bilaterally.

Hepatobiliary: No cystic or solid hepatic lesions. No intra or
extrahepatic biliary ductal dilatation. Gallbladder is unremarkable
in appearance.

Pancreas: No pancreatic mass. No pancreatic ductal dilatation. No
pancreatic or peripancreatic fluid or inflammatory changes.

Spleen: Unremarkable.

Adrenals/Urinary Tract: 2.3 cm simple cyst in the upper pole of the
left kidney. Several other subcentimeter low-attenuation lesions in
both kidneys are too small to definitively characterize, but are
statistically likely to represent tiny cysts. No suspicious renal
lesions. No hydroureteronephrosis. Bilateral adrenal glands are
normal in appearance. Urinary bladder is normal in appearance.

Stomach/Bowel: Normal appearance of the stomach. No pathologic
dilatation of small bowel or colon. Numerous colonic diverticulae
are noted. In the region of the proximal to mid sigmoid colon there
is extensive mural thickening adjacent to diverticuli which appear
inflamed, with haziness and stranding in the adjacent sigmoid
mesocolon, compatible with an acute diverticulitis. No discrete
diverticular abscess is confidently identified at this time. Normal
appendix.

Vascular/Lymphatic: Aortic atherosclerosis, without evidence of
aneurysm or dissection in the abdominal or pelvic vasculature. No
lymphadenopathy noted in the abdomen or pelvis.

Reproductive: Prostate gland and seminal vesicles are unremarkable
in appearance.

Other: Trace volume of ascites adjacent to the inflamed sigmoid
colon. No larger volume of ascites. No pneumoperitoneum.

Musculoskeletal: There are no aggressive appearing lytic or blastic
lesions noted in the visualized portions of the skeleton.
IMPRESSION: 1. Acute diverticulitis of the proximal to mid sigmoid colon. No
discrete diverticular abscess or definite signs of frank perforation
are noted at this time.
2. Normal appendix.
3. Aortic atherosclerosis.
4. Additional incidental findings, as above.

Aortic Atherosclerosis ([id number]).

## 2019-04-14 ENCOUNTER — Other Ambulatory Visit: Payer: Self-pay | Admitting: Nurse Practitioner

## 2019-04-14 DIAGNOSIS — I10 Essential (primary) hypertension: Secondary | ICD-10-CM

## 2019-05-09 ENCOUNTER — Other Ambulatory Visit: Payer: Self-pay

## 2019-05-09 DIAGNOSIS — Z20822 Contact with and (suspected) exposure to covid-19: Secondary | ICD-10-CM

## 2019-05-11 ENCOUNTER — Telehealth: Payer: Self-pay

## 2019-05-11 LAB — NOVEL CORONAVIRUS, NAA: SARS-CoV-2, NAA: NOT DETECTED

## 2019-05-11 NOTE — Telephone Encounter (Signed)
Patient called in requesting [facility] results and MyChart password reset - DOB/Address verified - results pending. Reviewed testing process with patient, reset MyChart password, no further questions.

## 2019-07-02 ENCOUNTER — Other Ambulatory Visit: Payer: Self-pay | Admitting: Nurse Practitioner

## 2019-07-02 DIAGNOSIS — I10 Essential (primary) hypertension: Secondary | ICD-10-CM

## 2019-07-02 NOTE — Telephone Encounter (Signed)
MMM NTBS 30 days given 04/15/19

## 2019-07-03 ENCOUNTER — Other Ambulatory Visit: Payer: Self-pay

## 2019-07-03 ENCOUNTER — Ambulatory Visit: Payer: BLUE CROSS/BLUE SHIELD | Admitting: Nurse Practitioner

## 2019-07-03 ENCOUNTER — Encounter: Payer: Self-pay | Admitting: Nurse Practitioner

## 2019-07-03 VITALS — BP 162/94 | HR 68 | Temp 98.9°F | Ht 74.0 in | Wt 247.6 lb

## 2019-07-03 DIAGNOSIS — E785 Hyperlipidemia, unspecified: Secondary | ICD-10-CM | POA: Insufficient documentation

## 2019-07-03 DIAGNOSIS — Z6831 Body mass index (BMI) 31.0-31.9, adult: Secondary | ICD-10-CM

## 2019-07-03 DIAGNOSIS — I10 Essential (primary) hypertension: Secondary | ICD-10-CM | POA: Diagnosis not present

## 2019-07-03 DIAGNOSIS — E7800 Pure hypercholesterolemia, unspecified: Secondary | ICD-10-CM | POA: Diagnosis not present

## 2019-07-03 DIAGNOSIS — J301 Allergic rhinitis due to pollen: Secondary | ICD-10-CM

## 2019-07-03 MED ORDER — FLUTICASONE PROPIONATE 50 MCG/ACT NA SUSP: 2.0000 | Freq: Every day | NASAL | 6 refills | Status: DC

## 2019-07-03 MED ORDER — LISINOPRIL 20 MG PO TABS
20.0000 mg | ORAL_TABLET | Freq: Every day | ORAL | 1 refills | Status: DC
Start: 2019-07-03 — End: 2019-10-16

## 2019-07-03 NOTE — Patient Instructions (Signed)

## 2019-07-03 NOTE — Progress Notes (Signed)
Subjective:     Patient ID: Allen Herring, male   DOB: [birth year], 59 y.o.   MRN: [id number]   Chief Complaint: Medical Management of Chronic Issues    HPI:   1. Essential hypertension BP up today. Out of medicine for the past 3 days. Checks BP at home about once a week. Usually controlled. No chest pain, SOB, headaches, dizziness. BP Readings from Last 3 Encounters:  07/03/19 (!) 162/94  02/23/18 130/85  12/11/17 111/74     2. Seasonal allergic rhinitis due to pollen Using flonase more. Says heat at home dries out sinuses. No cough, runny nose, sore throat, itchy eyes. Occasional nose bleeds last one 3-4 weeks ago when not using flonase.  3. BMI 30.0-30.9,adult Trying to watch diet. Little bit of exercise-weight lifting at home, walking. Wt Readings from Last 3 Encounters:  07/03/19 247 lb 9.6 oz (112.3 kg)  02/23/18 237 lb (107.5 kg)  12/11/17 243 lb (110.2 kg)   BMI Readings from Last 3 Encounters:  07/03/19 31.79 kg/m  02/23/18 30.43 kg/m  12/11/17 31.20 kg/m     4. Pure hypercholesterolemia Working on low fat diet for cholesterol control.  Lab Results  Component Value Date   CHOL 160 02/23/2018   HDL 31 (L) 02/23/2018   LDLCALC 104 (H) 02/23/2018   TRIG 123 02/23/2018   CHOLHDL 5.2 (H) 02/23/2018      Outpatient Encounter Medications as of 07/03/2019  Medication Sig  . fluticasone (FLONASE) 50 MCG/ACT nasal spray Place 2 sprays into both nostrils daily.  Marland Kitchen lisinopril (ZESTRIL) 20 MG tablet Take 1 tablet (20 mg total) by mouth daily. (Needs to be seen before next refill)   No facility-administered encounter medications on file as of 07/03/2019.    Past Surgical History:  Procedure Laterality Date  . CERVICAL SPINE SURGERY  05/2016  . Dental extractions      Family History  Problem Relation Age of Onset  . Cancer Maternal Grandfather        head and neck cancer  . Fibromyalgia Sister   . Hypertension Brother     New  complaints: None  Social history: Lives with girlfriend. 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 Son lives nearby.  Controlled substance contract: N/a    Review of Systems  Constitutional: Negative for diaphoresis.  Eyes: Negative for pain.  Respiratory: Negative for shortness of breath.   Cardiovascular: Negative for chest pain, palpitations and leg swelling.  Gastrointestinal: Negative for abdominal pain.  Endocrine: Negative for polydipsia.  Skin: Negative for rash.  Neurological: Negative for dizziness, weakness and headaches.  Hematological: Does not bruise/bleed easily.  All other systems reviewed and are negative.      Objective:   Physical Exam Vitals and nursing note reviewed.  Constitutional:      Appearance: Normal appearance. He is well-developed.  HENT:     Head: Normocephalic.     Nose: Nose normal.  Eyes:     Pupils: Pupils are equal, round, and reactive to light.  Neck:     Thyroid: No thyroid mass or thyromegaly.     Vascular: No carotid bruit or JVD.     Trachea: Phonation normal.  Cardiovascular:     Rate and Rhythm: Normal rate and regular rhythm.  Pulmonary:     Effort: Pulmonary effort is normal. No respiratory distress.     Breath sounds: Normal breath sounds.  Abdominal:     General: Bowel sounds are normal.     Palpations: Abdomen  is soft.     Tenderness: There is no abdominal tenderness.  Musculoskeletal:        General: Normal range of motion.     Cervical back: Normal range of motion and neck supple.  Lymphadenopathy:     Cervical: No cervical adenopathy.  Skin:    General: Skin is warm and dry.  Neurological:     Mental Status: He is alert and oriented to person, place, and time.  Psychiatric:        Behavior: Behavior normal.        Thought Content: Thought content normal.        Judgment: Judgment normal.   BP (!) 162/94   Pulse 68   Temp 98.9 F (37.2 C) (Temporal)   Ht '6\' 2"'$  (1.88 m)   Wt 247 lb 9.6  oz (112.3 kg)   SpO2 96%   BMI 31.79 kg/m       Assessment: Plan        Colin Heinbaugh comes in today with chief complaint of Medical Management of Chronic Issues   Diagnosis and orders addressed:  1. Essential hypertension Low sodium diet - lisinopril (ZESTRIL) 20 MG tablet; Take 1 tablet (20 mg total) by mouth daily. (Needs to be seen before next refill)  Dispense: 90 tablet; Refill: 1 - [id number]+EGFR  2. Seasonal allergic rhinitis due to pollen Avoid allergens - fluticasone (FLONASE) 50 MCG/ACT nasal spray; Place 2 sprays into both nostrils daily.  Dispense: 16 g; Refill: 6  3. BMI 31.0-31.9,adult Discussed diet and exercise for person with BMI >25 Will recheck weight in 3-6 months  4. Pure hypercholesterolemia Low fat diet - Lipid panel   Labs pending Health Maintenance reviewed Diet and exercise encouraged  Follow up plan: 77month   Mary-Margaret MHassell Done FNP

## 2019-07-04 LAB — CMP14+EGFR
ALT: 36 IU/L (ref 0–44)
AST: 32 IU/L (ref 0–40)
Albumin/Globulin Ratio: 1.9 (ref 1.2–2.2)
Albumin: 4.5 g/dL (ref 3.8–4.9)
Alkaline Phosphatase: 61 IU/L (ref 39–117)
BUN/Creatinine Ratio: 16 (ref 9–20)
BUN: 15 mg/dL (ref 6–24)
Bilirubin Total: 0.3 mg/dL (ref 0.0–1.2)
CO2: 23 mmol/L (ref 20–29)
Calcium: 9.2 mg/dL (ref 8.7–10.2)
Chloride: 106 mmol/L (ref 96–106)
Creatinine, Ser: 0.93 mg/dL (ref 0.76–1.27)
GFR calc Af Amer: 104 mL/min/{1.73_m2} (ref >59)
GFR calc non Af Amer: 90 mL/min/{1.73_m2} (ref >59)
Globulin, Total: 2.4 g/dL (ref 1.5–4.5)
Glucose: 88 mg/dL (ref 65–99)
Potassium: 3.8 mmol/L (ref 3.5–5.2)
Sodium: 143 mmol/L (ref 134–144)
Total Protein: 6.9 g/dL (ref 6.0–8.5)

## 2019-07-04 LAB — LIPID PANEL
Chol/HDL Ratio: 5.9 ratio — ABNORMAL HIGH (ref 0.0–5.0)
Cholesterol, Total: 158 mg/dL (ref 100–199)
HDL: 27 mg/dL — ABNORMAL LOW (ref >39)
LDL Chol Calc (NIH): 84 mg/dL (ref 0–99)
Triglycerides: 281 mg/dL — ABNORMAL HIGH (ref 0–149)
VLDL Cholesterol Cal: 47 mg/dL — ABNORMAL HIGH (ref 5–40)

## 2019-10-15 ENCOUNTER — Other Ambulatory Visit: Payer: Self-pay | Admitting: Nurse Practitioner

## 2019-10-15 DIAGNOSIS — I10 Essential (primary) hypertension: Secondary | ICD-10-CM

## 2019-11-26 ENCOUNTER — Other Ambulatory Visit: Payer: Self-pay

## 2019-11-26 ENCOUNTER — Ambulatory Visit: Payer: PRIVATE HEALTH INSURANCE | Admitting: Family

## 2019-11-26 ENCOUNTER — Encounter: Payer: Self-pay | Admitting: Family

## 2019-11-26 DIAGNOSIS — I10 Essential (primary) hypertension: Secondary | ICD-10-CM | POA: Diagnosis not present

## 2019-11-26 DIAGNOSIS — M546 Pain in thoracic spine: Secondary | ICD-10-CM | POA: Diagnosis not present

## 2019-11-26 MED ORDER — BACLOFEN 10 MG PO TABS: 10.0000 mg | ORAL_TABLET | Freq: Three times a day (TID) | ORAL | 0 refills | Status: DC

## 2019-11-26 MED ORDER — DICLOFENAC SODIUM 75 MG PO TBEC: 75.0000 mg | DELAYED_RELEASE_TABLET | Freq: Two times a day (BID) | ORAL | 0 refills | Status: DC

## 2019-11-26 MED ORDER — LISINOPRIL 20 MG PO TABS: 20.0000 mg | ORAL_TABLET | Freq: Every day | ORAL | 0 refills | Status: DC

## 2019-11-26 NOTE — Patient Instructions (Signed)
Acute Back Pain, Adult Acute back pain is sudden and usually short-lived. It is often caused by an injury to the muscles and tissues in the back. The injury may result from:  A muscle or ligament getting overstretched or torn (strained). Ligaments are tissues that connect bones to each other. Lifting something improperly can cause a back strain.  Wear and tear (degeneration) of the spinal disks. Spinal disks are circular tissue that provides cushioning between the bones of the spine (vertebrae).  Twisting motions, such as while playing sports or doing yard work.  A hit to the back.  Arthritis. You may have a physical exam, lab tests, and imaging tests to find the cause of your pain. Acute back pain usually goes away with rest and home care. Follow these instructions at home: Managing pain, stiffness, and swelling  Take over-the-counter and prescription medicines only as told by your health care provider.  Your health care provider may recommend applying ice during the first 24-48 hours after your pain starts. To do this: ? Put ice in a plastic bag. ? Place a towel between your skin and the bag. ? Leave the ice on for 20 minutes, 2-3 times a day.  If directed, apply heat to the affected area as often as told by your health care provider. Use the heat source that your health care provider recommends, such as a moist heat pack or a heating pad. ? Place a towel between your skin and the heat source. ? Leave the heat on for 20-30 minutes. ? Remove the heat if your skin turns bright red. This is especially important if you are unable to feel pain, heat, or cold. You have a greater risk of getting burned. Activity   Do not stay in bed. Staying in bed for more than 1-2 days can delay your recovery.  Sit up and stand up straight. Avoid leaning forward when you sit, or hunching over when you stand. ? If you work at a desk, sit close to it so you do not need to lean over. Keep your chin tucked  in. Keep your neck drawn back, and keep your elbows bent at a right angle. Your arms should look like the letter "L." ? Sit high and close to the steering wheel when you drive. Add lower back (lumbar) support to your car seat, if needed.  Take short walks on even surfaces as soon as you are able. Try to increase the length of time you walk each day.  Do not sit, drive, or stand in one place for more than 30 minutes at a time. Sitting or standing for long periods of time can put stress on your back.  Do not drive or use heavy machinery while taking prescription pain medicine.  Use proper lifting techniques. When you bend and lift, use positions that put less stress on your back: ? Bend your knees. ? Keep the load close to your body. ? Avoid twisting.  Exercise regularly as told by your health care provider. Exercising helps your back heal faster and helps prevent back injuries by keeping muscles strong and flexible.  Work with a physical therapist to make a safe exercise program, as recommended by your health care provider. Do any exercises as told by your physical therapist. Lifestyle  Maintain a healthy weight. Extra weight puts stress on your back and makes it difficult to have good posture.  Avoid activities or situations that make you feel anxious or stressed. Stress and anxiety increase muscle   tension and can make back pain worse. Learn ways to manage anxiety and stress, such as through exercise. General instructions  Sleep on a firm mattress in a comfortable position. Try lying on your side with your knees slightly bent. If you lie on your back, put a pillow under your knees.  Follow your treatment plan as told by your health care provider. This may include: ? Cognitive or behavioral therapy. ? Acupuncture or massage therapy. ? Meditation or yoga. Contact a health care provider if:  You have pain that is not relieved with rest or medicine.  You have increasing pain going down  into your legs or buttocks.  Your pain does not improve after 2 weeks.  You have pain at night.  You lose weight without trying.  You have a fever or chills. Get help right away if:  You develop new bowel or bladder control problems.  You have unusual weakness or numbness in your arms or legs.  You develop nausea or vomiting.  You develop abdominal pain.  You feel faint. Summary  Acute back pain is sudden and usually short-lived.  Use proper lifting techniques. When you bend and lift, use positions that put less stress on your back.  Take over-the-counter and prescription medicines and apply heat or ice as directed by your health care provider. This information is not intended to replace advice given to you by your health care provider. Make sure you discuss any questions you have with your health care provider. Document Revised: 09/11/2018 Document Reviewed: 01/04/2017 Elsevier Patient Education  2020 [employer] Inc[employer]  
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
## 2020-03-02 ENCOUNTER — Other Ambulatory Visit: Payer: Self-pay

## 2020-03-02 ENCOUNTER — Encounter: Payer: Self-pay | Admitting: Nurse Practitioner

## 2020-03-02 ENCOUNTER — Ambulatory Visit (INDEPENDENT_AMBULATORY_CARE_PROVIDER_SITE_OTHER): Payer: PRIVATE HEALTH INSURANCE | Admitting: Nurse Practitioner

## 2020-03-02 VITALS — BP 144/99 | HR 70 | Temp 98.5°F | Resp 20 | Ht 74.0 in | Wt 245.0 lb

## 2020-03-02 DIAGNOSIS — E7800 Pure hypercholesterolemia, unspecified: Secondary | ICD-10-CM | POA: Diagnosis not present

## 2020-03-02 DIAGNOSIS — Z125 Encounter for screening for malignant neoplasm of prostate: Secondary | ICD-10-CM | POA: Diagnosis not present

## 2020-03-02 DIAGNOSIS — Z6830 Body mass index (BMI) 30.0-30.9, adult: Secondary | ICD-10-CM | POA: Diagnosis not present

## 2020-03-02 DIAGNOSIS — I10 Essential (primary) hypertension: Secondary | ICD-10-CM

## 2020-03-02 MED ORDER — LISINOPRIL 20 MG PO TABS: 20.0000 mg | ORAL_TABLET | Freq: Every day | ORAL | 1 refills | Status: DC

## 2020-03-02 NOTE — Progress Notes (Signed)
Subjective:    Patient ID: Allen Herring, male    DOB: Jan 29, 1961, 59 y.o.   MRN: [id number]    Chief Complaint: Medical Management of Chronic Issues    HPI:  1. Essential hypertension BP Readings from Last 3 Encounters:  03/02/20 (!) 144/99  11/26/19 128/86  07/03/19 (!) 162/94   Takes medication as prescribed but has ran out x 2 days. Does not check blood pressure at home. Denies chest pain, SOB, or headaches. Does not add salt to foods.    2. BMI 30.0-30.9,adult BMI Readings from Last 3 Encounters:  03/02/20 31.46 kg/m  11/26/19 31.33 kg/m  07/03/19 31.79 kg/m   Wt Readings from Last 3 Encounters:  03/02/20 245 lb (111.1 kg)  11/26/19 244 lb (110.7 kg)  07/03/19 247 lb 9.6 oz (112.3 kg)   Technician, walks a lot at work.    3. Pure hypercholesterolemia Lab Results  Component Value Date   CHOL 158 07/03/2019   HDL 27 (L) 07/03/2019   LDLCALC 84 07/03/2019   TRIG 281 (H) 07/03/2019   CHOLHDL 5.9 (H) 07/03/2019   Avoids fried and fatty foods.     Outpatient Encounter Medications as of 03/02/2020  Medication Sig  . fluticasone (FLONASE) 50 MCG/ACT nasal spray Place 2 sprays into both nostrils daily.  Marland Kitchen lisinopril (ZESTRIL) 20 MG tablet Take 1 tablet (20 mg total) by mouth daily.  . [DISCONTINUED] baclofen (LIORESAL) 10 MG tablet Take 1 tablet (10 mg total) by mouth 3 (three) times daily.  . [DISCONTINUED] diclofenac (VOLTAREN) 75 MG EC tablet Take 1 tablet (75 mg total) by mouth 2 (two) times daily.   No facility-administered encounter medications on file as of 03/02/2020.    Past Surgical History:  Procedure Laterality Date  . CERVICAL SPINE SURGERY  05/2016  . Dental extractions      Family History  Problem Relation Age of Onset  . Cancer Maternal Grandfather        head and neck cancer  . Fibromyalgia Sister   . Hypertension Brother     New complaints: No new complaints today.   Social history: Lives with girlfriend, rides motorcycles,  camps.   Controlled substance contract: n/a   Review of Systems  Constitutional: Negative.   HENT: Negative.   Eyes: Negative.   Respiratory: Negative.   Cardiovascular: Negative.   Gastrointestinal: Negative.   Endocrine: Negative.   Genitourinary: Negative.   Musculoskeletal: Negative.   Skin: Negative.   Allergic/Immunologic: Negative.   Neurological: Negative.   Hematological: Negative.   Psychiatric/Behavioral: Negative.   All other systems reviewed and are negative.      Objective:   Physical Exam Vitals and nursing note reviewed.  Constitutional:      Appearance: Normal appearance.  HENT:     Head: Normocephalic and atraumatic.     Right Ear: External ear normal.     Left Ear: External ear normal.     Nose: Nose normal.     Mouth/Throat:     Mouth: Mucous membranes are moist.     Pharynx: Oropharynx is clear.  Eyes:     Pupils: Pupils are equal, round, and reactive to light.  Cardiovascular:     Rate and Rhythm: Normal rate and regular rhythm.     Pulses: Normal pulses.     Heart sounds: Normal heart sounds.  Pulmonary:     Effort: Pulmonary effort is normal.     Breath sounds: Normal breath sounds.  Abdominal:     General:  Bowel sounds are normal.     Palpations: Abdomen is soft.  Musculoskeletal:        General: Normal range of motion.     Cervical back: Normal range of motion.  Skin:    General: Skin is warm and dry.     Capillary Refill: Capillary refill takes less than 2 seconds.  Neurological:     General: No focal deficit present.     Mental Status: He is alert and oriented to person, place, and time. Mental status is at baseline.  Psychiatric:        Mood and Affect: Mood normal.        Behavior: Behavior normal.        Thought Content: Thought content normal.        Judgment: Judgment normal.    BP (!) 144/99   Pulse 70   Temp 98.5 F (36.9 C) (Temporal)   Resp 20   Ht 6\' 2"  (1.88 m)   Wt 245 lb (111.1 kg)   SpO2 97%   BMI 31.46  kg/m       Assessment & Plan:  Allen Herring comes in today with chief complaint of Medical Management of Chronic Issues   Diagnosis and orders addressed:  1. Essential hypertension Take medication as prescribed. Eat a heart healthy diet and avoid foods high in salt.   2. BMI 30.0-30.9,adult Exercise regularly. Walking is a great cardiovascular exercise that helps lower blood pressure and cholesterol levels.   3. Pure hypercholesterolemia Avoid foods that are high in fat or fried.    Labs pending Health Maintenance reviewed Diet and exercise encouraged  Follow up plan: Follow up in 6 months.    Mary-Margaret Hassell Done, FNP

## 2020-03-02 NOTE — Patient Instructions (Signed)
DASH Eating Plan DASH stands for "Dietary Approaches to Stop Hypertension." The DASH eating plan is a healthy eating plan that has been shown to reduce high blood pressure (hypertension). It may also reduce your risk for type 2 diabetes, heart disease, and stroke. The DASH eating plan may also help with weight loss. What are tips for following this plan?  General guidelines  Avoid eating more than 2,300 mg (milligrams) of salt (sodium) a day. If you have hypertension, you may need to reduce your sodium intake to 1,500 mg a day.  Limit alcohol intake to no more than 1 drink a day for nonpregnant women and 2 drinks a day for men. One drink equals 12 oz of beer, 5 oz of wine, or 1 oz of hard liquor.  Work with your health care provider to maintain a healthy body weight or to lose weight. Ask what an ideal weight is for you.  Get at least 30 minutes of exercise that causes your heart to beat faster (aerobic exercise) most days of the week. Activities may include walking, swimming, or biking.  Work with your health care provider or diet and nutrition specialist (dietitian) to adjust your eating plan to your individual calorie needs. Reading food labels   Check food labels for the amount of sodium per serving. Choose foods with less than 5 percent of the Daily Value of sodium. Generally, foods with less than 300 mg of sodium per serving fit into this eating plan.  To find whole grains, look for the word "whole" as the first word in the ingredient list. Shopping  Buy products labeled as "low-sodium" or "no salt added."  Buy fresh foods. Avoid canned foods and premade or frozen meals. Cooking  Avoid adding salt when cooking. Use salt-free seasonings or herbs instead of table salt or sea salt. Check with your health care provider or pharmacist before using salt substitutes.  Do not fry foods. Cook foods using healthy methods such as baking, boiling, grilling, and broiling instead.  Cook with  heart-healthy oils, such as olive, canola, soybean, or sunflower oil. Meal planning  Eat a balanced diet that includes: ? 5 or more servings of fruits and vegetables each day. At each meal, try to fill half of your plate with fruits and vegetables. ? Up to 6-8 servings of whole grains each day. ? Less than 6 oz of lean meat, poultry, or fish each day. A 3-oz serving of meat is about the same size as a deck of cards. One egg equals 1 oz. ? 2 servings of low-fat dairy each day. ? A serving of nuts, seeds, or beans 5 times each week. ? Heart-healthy fats. Healthy fats called Omega-3 fatty acids are found in foods such as flaxseeds and coldwater fish, like sardines, salmon, and mackerel.  Limit how much you eat of the following: ? Canned or prepackaged foods. ? Food that is high in trans fat, such as fried foods. ? Food that is high in saturated fat, such as fatty meat. ? Sweets, desserts, sugary drinks, and other foods with added sugar. ? Full-fat dairy products.  Do not salt foods before eating.  Try to eat at least 2 vegetarian meals each week.  Eat more home-cooked food and less restaurant, buffet, and fast food.  When eating at a restaurant, ask that your food be prepared with less salt or no salt, if possible. What foods are recommended? The items listed may not be a complete list. Talk with your dietitian about   what dietary choices are best for you. Grains Whole-grain or whole-wheat bread. Whole-grain or whole-wheat pasta. Brown rice. Oatmeal. Quinoa. Bulgur. Whole-grain and low-sodium cereals. Pita bread. Low-fat, low-sodium crackers. Whole-wheat flour tortillas. Vegetables Fresh or frozen vegetables (raw, steamed, roasted, or grilled). Low-sodium or reduced-sodium tomato and vegetable juice. Low-sodium or reduced-sodium tomato sauce and tomato paste. Low-sodium or reduced-sodium canned vegetables. Fruits All fresh, dried, or frozen fruit. Canned fruit in natural juice (without  added sugar). Meat and other protein foods Skinless chicken or turkey. Ground chicken or turkey. Pork with fat trimmed off. Fish and seafood. Egg whites. Dried beans, peas, or lentils. Unsalted nuts, nut butters, and seeds. Unsalted canned beans. Lean cuts of beef with fat trimmed off. Low-sodium, lean deli meat. Dairy Low-fat (1%) or fat-free (skim) milk. Fat-free, low-fat, or reduced-fat cheeses. Nonfat, low-sodium ricotta or cottage cheese. Low-fat or nonfat yogurt. Low-fat, low-sodium cheese. Fats and oils Soft margarine without trans fats. Vegetable oil. Low-fat, reduced-fat, or light mayonnaise and salad dressings (reduced-sodium). Canola, safflower, olive, soybean, and sunflower oils. Avocado. Seasoning and other foods Herbs. Spices. Seasoning mixes without salt. Unsalted popcorn and pretzels. Fat-free sweets. What foods are not recommended? The items listed may not be a complete list. Talk with your dietitian about what dietary choices are best for you. Grains Baked goods made with fat, such as croissants, muffins, or some breads. Dry pasta or rice meal packs. Vegetables Creamed or fried vegetables. Vegetables in a cheese sauce. Regular canned vegetables (not low-sodium or reduced-sodium). Regular canned tomato sauce and paste (not low-sodium or reduced-sodium). Regular tomato and vegetable juice (not low-sodium or reduced-sodium). Pickles. Olives. Fruits Canned fruit in a light or heavy syrup. Fried fruit. Fruit in cream or butter sauce. Meat and other protein foods Fatty cuts of meat. Ribs. Fried meat. Bacon. Sausage. Bologna and other processed lunch meats. Salami. Fatback. Hotdogs. Bratwurst. Salted nuts and seeds. Canned beans with added salt. Canned or smoked fish. Whole eggs or egg yolks. Chicken or turkey with skin. Dairy Whole or 2% milk, cream, and half-and-half. Whole or full-fat cream cheese. Whole-fat or sweetened yogurt. Full-fat cheese. Nondairy creamers. Whipped toppings.  Processed cheese and cheese spreads. Fats and oils Butter. Stick margarine. Lard. Shortening. Ghee. Bacon fat. Tropical oils, such as coconut, palm kernel, or palm oil. Seasoning and other foods Salted popcorn and pretzels. Onion salt, garlic salt, seasoned salt, table salt, and sea salt. Worcestershire sauce. Tartar sauce. Barbecue sauce. Teriyaki sauce. Soy sauce, including reduced-sodium. Steak sauce. Canned and packaged gravies. Fish sauce. Oyster sauce. Cocktail sauce. Horseradish that you find on the shelf. Ketchup. Mustard. Meat flavorings and tenderizers. Bouillon cubes. Hot sauce and Tabasco sauce. Premade or packaged marinades. Premade or packaged taco seasonings. Relishes. Regular salad dressings. Where to find more information:  National Heart, Lung, and Blood Institute: www.nhlbi.nih.gov  American Heart Association: www.heart.org Summary  The DASH eating plan is a healthy eating plan that has been shown to reduce high blood pressure (hypertension). It may also reduce your risk for type 2 diabetes, heart disease, and stroke.  With the DASH eating plan, you should limit salt (sodium) intake to 2,300 mg a day. If you have hypertension, you may need to reduce your sodium intake to 1,500 mg a day.  When on the DASH eating plan, aim to eat more fresh fruits and vegetables, whole grains, lean proteins, low-fat dairy, and heart-healthy fats.  Work with your health care provider or diet and nutrition specialist (dietitian) to adjust your eating plan to your   individual calorie needs. This information is not intended to replace advice given to you by your health care provider. Make sure you discuss any questions you have with your health care provider. Document Revised: 05/05/2017 Document Reviewed: 05/16/2016 Elsevier Patient Education  2020 Elsevier Inc.  

## 2020-03-03 LAB — CBC WITH DIFFERENTIAL/PLATELET
Basophils Absolute: 0.1 10*3/uL (ref 0.0–0.2)
Basos: 1 %
EOS (ABSOLUTE): 0.2 10*3/uL (ref 0.0–0.4)
Eos: 2 %
Hematocrit: 51.4 % — ABNORMAL HIGH (ref 37.5–51.0)
Hemoglobin: 17.2 g/dL (ref 13.0–17.7)
Immature Grans (Abs): 0 10*3/uL (ref 0.0–0.1)
Immature Granulocytes: 0 %
Lymphocytes Absolute: 2 10*3/uL (ref 0.7–3.1)
Lymphs: 22 %
MCH: 30.2 pg (ref 26.6–33.0)
MCHC: 33.5 g/dL (ref 31.5–35.7)
MCV: 90 fL (ref 79–97)
Monocytes Absolute: 0.7 10*3/uL (ref 0.1–0.9)
Monocytes: 8 %
Neutrophils Absolute: 6.1 10*3/uL (ref 1.4–7.0)
Neutrophils: 67 %
Platelets: 210 10*3/uL (ref 150–450)
RBC: 5.69 x10E6/uL (ref 4.14–5.80)
RDW: 13.6 % (ref 11.6–15.4)
WBC: 9 10*3/uL (ref 3.4–10.8)

## 2020-03-03 LAB — CMP14+EGFR
ALT: 36 IU/L (ref 0–44)
AST: 30 IU/L (ref 0–40)
Albumin/Globulin Ratio: 1.8 (ref 1.2–2.2)
Albumin: 4.6 g/dL (ref 3.8–4.9)
Alkaline Phosphatase: 55 IU/L (ref 44–121)
BUN/Creatinine Ratio: 16 (ref 10–24)
BUN: 14 mg/dL (ref 8–27)
Bilirubin Total: 0.4 mg/dL (ref 0.0–1.2)
CO2: 23 mmol/L (ref 20–29)
Calcium: 9.7 mg/dL (ref 8.6–10.2)
Chloride: 102 mmol/L (ref 96–106)
Creatinine, Ser: 0.88 mg/dL (ref 0.76–1.27)
GFR calc Af Amer: 108 mL/min/{1.73_m2} (ref >59)
GFR calc non Af Amer: 93 mL/min/{1.73_m2} (ref >59)
Globulin, Total: 2.5 g/dL (ref 1.5–4.5)
Glucose: 83 mg/dL (ref 65–99)
Potassium: 4.2 mmol/L (ref 3.5–5.2)
Sodium: 142 mmol/L (ref 134–144)
Total Protein: 7.1 g/dL (ref 6.0–8.5)

## 2020-03-03 LAB — PSA, TOTAL AND FREE
PSA, Free Pct: 22.5 %
PSA, Free: 0.27 ng/mL
Prostate Specific Ag, Serum: 1.2 ng/mL (ref 0.0–4.0)

## 2020-03-03 LAB — LIPID PANEL
Chol/HDL Ratio: 6.5 ratio — ABNORMAL HIGH (ref 0.0–5.0)
Cholesterol, Total: 188 mg/dL (ref 100–199)
HDL: 29 mg/dL — ABNORMAL LOW (ref >39)
LDL Chol Calc (NIH): 116 mg/dL — ABNORMAL HIGH (ref 0–99)
Triglycerides: 244 mg/dL — ABNORMAL HIGH (ref 0–149)
VLDL Cholesterol Cal: 43 mg/dL — ABNORMAL HIGH (ref 5–40)

## 2020-04-21 ENCOUNTER — Encounter: Payer: Self-pay | Admitting: Nurse Practitioner

## 2020-04-21 ENCOUNTER — Ambulatory Visit (INDEPENDENT_AMBULATORY_CARE_PROVIDER_SITE_OTHER): Payer: PRIVATE HEALTH INSURANCE

## 2020-04-21 ENCOUNTER — Ambulatory Visit (INDEPENDENT_AMBULATORY_CARE_PROVIDER_SITE_OTHER): Payer: PRIVATE HEALTH INSURANCE | Admitting: Nurse Practitioner

## 2020-04-21 ENCOUNTER — Other Ambulatory Visit: Payer: Self-pay

## 2020-04-21 VITALS — BP 152/100 | HR 71 | Temp 98.4°F | Resp 20 | Ht 74.0 in | Wt 247.0 lb

## 2020-04-21 DIAGNOSIS — R319 Hematuria, unspecified: Secondary | ICD-10-CM

## 2020-04-21 DIAGNOSIS — N200 Calculus of kidney: Secondary | ICD-10-CM

## 2020-04-21 LAB — URINALYSIS, COMPLETE
Bilirubin, UA: NEGATIVE
Glucose, UA: NEGATIVE
Ketones, UA: NEGATIVE
Leukocytes,UA: NEGATIVE
Nitrite, UA: NEGATIVE
Protein,UA: NEGATIVE
Specific Gravity, UA: 1.015 (ref 1.005–1.030)
Urobilinogen, Ur: 0.2 mg/dL (ref 0.2–1.0)
pH, UA: 6 (ref 5.0–7.5)

## 2020-04-21 LAB — MICROSCOPIC EXAMINATION

## 2020-04-21 NOTE — Progress Notes (Signed)
Subjective:    Patient ID: Allen Herring, male    DOB: 26-May-1961, 59 y.o.   MRN: [id number]   Chief Complaint: Hematuria   HPI Pt is here today for complaints of blood in his urine over the weekend. He wasn't sure if he had another kidney stone or a bladder infection. He is having some pain in your lower back and in the right lower abdomen. Denies dysuria, decreased flow, incontinence, urgency. No hematuria since Saturday and has been drinking more water. Has had a little more urinary frequency since drinking more water. He has had a kidney stone previously but it has been a while. With that stone, he had hematuria and decreased urinary flow but did not have any abdominal pain. Denies injury prior to hematuria.    Review of Systems  Constitutional: Negative.   HENT: Negative.   Eyes: Negative.   Respiratory: Negative.   Cardiovascular: Negative.   Gastrointestinal: Positive for abdominal pain (right side/suprapubic).  Genitourinary: Positive for hematuria (none since Saturday).  Musculoskeletal: Positive for back pain (right CVA).  Skin: Negative.   Neurological: Negative.   Psychiatric/Behavioral: Negative.        Objective:   Physical Exam Vitals and nursing note reviewed.  Constitutional:      Appearance: Normal appearance.  HENT:     Head: Normocephalic.     Right Ear: Tympanic membrane normal.     Left Ear: Tympanic membrane normal.     Nose: Nose normal.     Mouth/Throat:     Mouth: Mucous membranes are moist.  Eyes:     Extraocular Movements: Extraocular movements intact.     Conjunctiva/sclera: Conjunctivae normal.  Cardiovascular:     Rate and Rhythm: Normal rate and regular rhythm.  Pulmonary:     Effort: Pulmonary effort is normal.     Breath sounds: Normal breath sounds.  Abdominal:     General: Bowel sounds are normal.     Palpations: Abdomen is soft.     Tenderness: There is abdominal tenderness in the right upper quadrant, right lower quadrant and  suprapubic area. There is right CVA tenderness (mild).  Musculoskeletal:        General: Normal range of motion.     Cervical back: Normal range of motion and neck supple.  Skin:    General: Skin is warm and dry.  Neurological:     General: No focal deficit present.     Mental Status: He is alert and oriented to person, place, and time.  Psychiatric:        Mood and Affect: Mood normal.        Behavior: Behavior normal.    BP (!) 152/100   Pulse 71   Temp 98.4 F (36.9 C) (Temporal)   Resp 20   Ht 6\' 2"  (1.88 m)   Wt 247 lb (112 kg)   SpO2 97%   BMI 31.71 kg/m      Assessment & Plan:  Allen Herring in today with chief complaint of Hematuria   1. Hematuria, unspecified type - Urinalysis, Complete - DG Abd 1 View  2. Kidney stone on right side Probably already past since no longer in pain Force fluids RTO if pain develops    The above assessment and management plan was discussed with the patient. The patient verbalized understanding of and has agreed to the management plan. Patient is aware to call the clinic if symptoms persist or worsen. Patient is aware when to return to the clinic  for a follow-up visit. Patient educated on when it is appropriate to go to the emergency department.   Mary-Margaret Hassell Done, FNP

## 2020-04-21 NOTE — Patient Instructions (Signed)
Kidney Stones  Kidney stones are solid, rock-like deposits that form inside of the kidneys. The kidneys are a pair of organs that make urine. A kidney stone may form in a kidney and move into other parts of the urinary tract, including the tubes that connect the kidneys to the bladder (ureters), the bladder, and the tube that carries urine out of the body (urethra). As the stone moves through these areas, it can cause intense pain and block the flow of urine. Kidney stones are created when high levels of certain minerals are found in the urine. The stones are usually passed out of the body through urination, but in some cases, medical treatment may be needed to remove them. What are the causes? Kidney stones may be caused by:  A condition in which certain glands produce too much parathyroid hormone (primary hyperparathyroidism), which causes too much calcium buildup in the blood.  A buildup of uric acid crystals in the bladder (hyperuricosuria). Uric acid is a chemical that the body produces when you eat certain foods. It usually exits the body in the urine.  Narrowing (stricture) of one or both of the ureters.  A kidney blockage that is present at birth (congenital obstruction).  Past surgery on the kidney or the ureters, such as gastric bypass surgery. What increases the risk? The following factors may make you more likely to develop this condition:  Having had a kidney stone in the past.  Having a family history of kidney stones.  Not drinking enough water.  Eating a diet that is high in protein, salt (sodium), or sugar.  Being overweight or obese. What are the signs or symptoms? Symptoms of a kidney stone may include:  Pain in the side of the abdomen, right below the ribs (flank pain). Pain usually spreads (radiates) to the groin.  Needing to urinate frequently or urgently.  Painful urination.  Blood in the urine (hematuria).  Nausea.  Vomiting.  Fever and chills. How  is this diagnosed? This condition may be diagnosed based on:  Your symptoms and medical history.  A physical exam.  Blood tests.  Urine tests. These may be done before and after the stone passes out of your body through urination.  Imaging tests, such as a CT scan, abdominal X-ray, or ultrasound.  A procedure to examine the inside of the bladder (cystoscopy). How is this treated? Treatment for kidney stones depends on the size, location, and makeup of the stones. Kidney stones will often pass out of the body through urination. You may need to:  Increase your fluid intake to help pass the stone. In some cases, you may be given fluids through an IV and may need to be monitored at the hospital.  Take medicine for pain.  Make changes in your diet to help prevent kidney stones from coming back. Sometimes, medical procedures are needed to remove a kidney stone. This may involve:  A procedure to break up kidney stones using: ? A focused beam of light (laser therapy). ? Shock waves (extracorporeal shock wave lithotripsy).  Surgery to remove kidney stones. This may be needed if you have severe pain or have stones that block your urinary tract. Follow these instructions at home: Medicines  Take over-the-counter and prescription medicines only as told by your health care provider.  Ask your health care provider if the medicine prescribed to you requires you to avoid driving or using heavy machinery. Eating and drinking  Drink enough fluid to keep your urine pale yellow.   You may be instructed to drink at least 8-10 glasses of water each day. This will help you pass the kidney stone.  If directed, change your diet. This may include: ? Limiting how much sodium you eat. ? Eating more fruits and vegetables. ? Limiting how much animal protein--such as red meat, poultry, fish, and eggs--you eat.  Follow instructions from your health care provider about eating or drinking  restrictions. General instructions  Collect urine samples as told by your health care provider. You may need to collect a urine sample: ? 24 hours after you pass the stone. ? 8-12 weeks after passing the kidney stone, and every 6-12 months after that.  Strain your urine every time you urinate, for as long as directed. Use the strainer that your health care provider recommends.  Do not throw out the kidney stone after passing it. Keep the stone so it can be tested by your health care provider. Testing the makeup of your kidney stone may help prevent you from getting kidney stones in the future.  Keep all follow-up visits as told by your health care provider. This is important. You may need follow-up X-rays or ultrasounds to make sure that your stone has passed. How is this prevented? To prevent another kidney stone:  Drink enough fluid to keep your urine pale yellow. This is the best way to prevent kidney stones.  Eat a healthy diet and follow recommendations from your health care provider about foods to avoid. You may be instructed to eat a low-protein diet. Recommendations vary depending on the type of kidney stone that you have.  Maintain a healthy weight. Where to find more information  National Kidney Foundation (NKF): www.kidney.org  [employer] (UCF): www.urologyhealth.org Contact a health care provider if:  You have pain that gets worse or does not get better with medicine. Get help right away if:  You have a fever or chills.  You develop severe pain.  You develop new abdominal pain.  You faint.  You are unable to urinate. Summary  Kidney stones are solid, rock-like deposits that form inside of the kidneys.  Kidney stones can cause nausea, vomiting, blood in the urine, abdominal pain, and the urge to urinate frequently.  Treatment for kidney stones depends on the size, location, and makeup of the stones. Kidney stones will often pass out of the body  through urination.  Kidney stones can be prevented by drinking enough fluids, eating a healthy diet, and maintaining a healthy weight. This information is not intended to replace advice given to you by your health care provider. Make sure you discuss any questions you have with your health care provider. Document Revised: 10/09/2018 Document Reviewed: 10/09/2018 Elsevier Patient Education  2020 Elsevier Inc.  

## 2020-05-11 ENCOUNTER — Other Ambulatory Visit: Payer: Self-pay

## 2020-05-11 ENCOUNTER — Ambulatory Visit: Payer: PRIVATE HEALTH INSURANCE | Admitting: Family Medicine

## 2020-05-11 DIAGNOSIS — R3989 Other symptoms and signs involving the genitourinary system: Secondary | ICD-10-CM | POA: Diagnosis not present

## 2020-05-11 DIAGNOSIS — R1030 Lower abdominal pain, unspecified: Secondary | ICD-10-CM | POA: Diagnosis not present

## 2020-05-11 LAB — MICROSCOPIC EXAMINATION

## 2020-05-11 LAB — URINALYSIS, COMPLETE
Bilirubin, UA: NEGATIVE
Glucose, UA: NEGATIVE
Ketones, UA: NEGATIVE
Leukocytes,UA: NEGATIVE
Nitrite, UA: NEGATIVE
Protein,UA: NEGATIVE
Specific Gravity, UA: 1.005 (ref 1.005–1.030)
Urobilinogen, Ur: 0.2 mg/dL (ref 0.2–1.0)
pH, UA: 5.5 (ref 5.0–7.5)

## 2020-05-11 MED ORDER — CEPHALEXIN 500 MG PO CAPS: 500.0000 mg | ORAL_CAPSULE | Freq: Three times a day (TID) | ORAL | 0 refills | Status: AC

## 2020-05-11 MED ORDER — CEFTRIAXONE SODIUM 1 G IJ SOLR
1.0000 g | Freq: Once | INTRAMUSCULAR | Status: AC
  Administered 2020-05-11: 1 g via INTRAMUSCULAR

## 2020-05-11 NOTE — Addendum Note (Signed)
Addended by: Darliss Ridgel D on: 05/11/2020 03:04 PM   Modules accepted: Orders

## 2020-05-11 NOTE — Patient Instructions (Signed)
Pyelonephritis, Adult  Pyelonephritis is an infection that occurs in the kidney. The kidneys are organs that help clean the blood by moving waste out of the blood and into the pee (urine). This infection can happen quickly, or it can last for a long time. In most cases, it clears up with treatment and does not cause other problems. What are the causes? This condition may be caused by:  Germs (bacteria) going from the bladder up to the kidney. This may happen after having a bladder infection.  Germs going from the blood to the kidney. What increases the risk? This condition is more likely to develop in:  Pregnant women.  Older people.  People who have any of these conditions: ? Diabetes. ? Inflammation of the prostate gland (prostatitis), in males. ? Kidney stones or bladder stones. ? Other problems with the kidney or the parts of your body that carry pee from the kidneys to the bladder (ureters). ? Cancer.  People who have a small, thin tube (catheter) placed in the bladder.  People who are sexually active.  Women who use a medicine that kills sperm (spermicide) to prevent pregnancy.  People who have had a prior urinary tract infection (UTI). What are the signs or symptoms? Symptoms of this condition include:  Peeing often.  A strong urge to pee right away.  Burning or stinging when peeing.  Belly pain.  Back pain.  Pain in the side (flank area).  Fever or chills.  Blood in the pee, or dark pee.  Feeling sick to your stomach (nauseous) or throwing up (vomiting). How is this treated? This condition may be treated by:  Taking antibiotic medicines by mouth (orally).  Drinking enough fluids. If the infection is bad, you may need to stay in the hospital. You may be given antibiotics and fluids that are put directly into a vein through an IV tube. In some cases, other treatments may be needed. Follow these instructions at home: Medicines  Take your antibiotic  medicine as told by your doctor. Do not stop taking the antibiotic even if you start to feel better.  Take over-the-counter and prescription medicines only as told by your doctor. General instructions   Drink enough fluid to keep your pee pale yellow.  Avoid caffeine, tea, and carbonated drinks.  Pee (urinate) often. Avoid holding in pee for long periods of time.  Pee before and after sex.  After pooping (having a bowel movement), women should wipe from front to back. Use each tissue only once.  Keep all follow-up visits as told by your doctor. This is important. Contact a doctor if:  You do not feel better after 2 days.  Your symptoms get worse.  You have a fever. Get help right away if:  You cannot take your medicine or drink fluids as told.  You have chills and shaking.  You throw up.  You have very bad pain in your side or back.  You feel very weak or you pass out (faint). Summary  Pyelonephritis is an infection that occurs in the kidney.  In most cases, this infection clears up with treatment and does not cause other problems.  Take your antibiotic medicine as told by your doctor. Do not stop taking the antibiotic even if you start to feel better.  Drink enough fluid to keep your pee pale yellow. This information is not intended to replace advice given to you by your health care provider. Make sure you discuss any questions you have with  your health care provider. Document Revised: 03/27/2018 Document Reviewed: 03/27/2018 Elsevier Patient Education  2020 Elsevier Inc.  

## 2020-05-11 NOTE — Progress Notes (Signed)
Telephone visit  Subjective: CC: abdominal pain PCP: Chevis Pretty, FNP Allen Herring is a 59 y.o. male calls for telephone consult today. Patient provides verbal consent for consult held via phone.  Due to COVID-19 pandemic this visit was conducted virtually. This visit type was conducted due to national recommendations for restrictions regarding the COVID-19 Pandemic (e.g. social distancing, sheltering in place) in an effort to limit this patient's exposure and mitigate transmission in our community. All issues noted in this document were discussed and addressed.  A physical exam was not performed with this format.   Location of patient: home Location of provider: [facility] Others present for call: none  1. Abdominal pain Patient reports bilateral abdominal pain below umbilicus.  He reports temperature 99.35F.  No reports of diarrhea or vomiting.  He was seen a couple of weeks ago for suspected renal stone.  There is no appreciable renal stones noted on K[facility] and they thought maybe he had passed the stone.  His urinalysis did show some bacteria and blood.  Not sent for culture.  Has history of diverticulitis   ROS: Per HPI  No Known Allergies Past Medical History:  Diagnosis Date  . GERD (gastroesophageal reflux disease)   . Hypertension   . Polycythemia 10/17/2011    Current Outpatient Medications:  .  fluticasone (FLONASE) 50 MCG/ACT nasal spray, Place 2 sprays into both nostrils daily., Disp: 16 g, Rfl: 6 .  lisinopril (ZESTRIL) 20 MG tablet, Take 1 tablet (20 mg total) by mouth daily., Disp: 90 tablet, Rfl: 1  Assessment/ Plan: 59 y.o. male   Suspected UTI - Plan: Urinalysis, Complete, Urine Culture, cefTRIAXone (ROCEPHIN) injection 1 g, cephALEXin (KEFLEX) 500 MG capsule  Lower abdominal pain - Plan: Urinalysis, Complete, Urine Culture, cefTRIAXone (ROCEPHIN) injection 1 g, cephALEXin (KEFLEX) 500 MG capsule  Worried that this may be trying to turn into  pyelonephritis and therefore I will have the nurse give him Rocephin 1 g intramuscularly.  He will start Keflex 3 times daily starting tomorrow.  Home care instructions were reviewed and we discussed that if symptoms worsen or do not get better within the next couple of days I want him seen in the office for physical examination.  He voiced good understanding and will follow up accordingly.  Work note provided excusing for the next 48 hours  Start time: 1:20pm End time: 1:28pm  Total time spent on patient care (including telephone call/ virtual visit): 8 minutes  [place], [place] [phone]

## 2020-05-13 ENCOUNTER — Telehealth: Payer: Self-pay

## 2020-05-13 LAB — URINE CULTURE: Organism ID, Bacteria: NO GROWTH

## 2020-05-13 NOTE — Telephone Encounter (Signed)
Janett Billow took the call on the lab results

## 2020-06-08 ENCOUNTER — Encounter: Payer: Self-pay | Admitting: Family

## 2020-06-08 ENCOUNTER — Ambulatory Visit (INDEPENDENT_AMBULATORY_CARE_PROVIDER_SITE_OTHER): Payer: PRIVATE HEALTH INSURANCE | Admitting: Family

## 2020-06-08 DIAGNOSIS — K5792 Diverticulitis of intestine, part unspecified, without perforation or abscess without bleeding: Secondary | ICD-10-CM

## 2020-06-08 DIAGNOSIS — R509 Fever, unspecified: Secondary | ICD-10-CM

## 2020-06-08 DIAGNOSIS — R1032 Left lower quadrant pain: Secondary | ICD-10-CM | POA: Diagnosis not present

## 2020-06-08 MED ORDER — CIPROFLOXACIN HCL 500 MG PO TABS: 500.0000 mg | ORAL_TABLET | Freq: Two times a day (BID) | ORAL | 0 refills | Status: DC

## 2020-06-08 MED ORDER — METRONIDAZOLE 500 MG PO TABS: 500.0000 mg | ORAL_TABLET | Freq: Two times a day (BID) | ORAL | 0 refills | Status: DC

## 2020-06-08 NOTE — Progress Notes (Signed)
   Virtual Visit via telephone Note Due to COVID-19 pandemic this visit was conducted virtually. This visit type was conducted due to national recommendations for restrictions regarding the COVID-19 Pandemic (e.g. social distancing, sheltering in place) in an effort to limit this patient's exposure and mitigate transmission in our community. All issues noted in this document were discussed and addressed.  A physical exam was not performed with this format.  I connected with Allen Herring on 06/08/20 at 12:06 pm by telephone and verified that I am speaking with the correct person using two identifiers. Allen Herring is currently located at work and no one is currently with him  during visit. The provider, Jannifer Rodney, FNP is located in their office at time of visit.  I discussed the limitations, risks, security and privacy concerns of performing an evaluation and management service by telephone and the availability of in person appointments. I also discussed with the patient that there may be a patient responsible charge related to this service. The patient expressed understanding and agreed to proceed.   History and Present Illness:  HPI   Pt calls the office today with recurrent abdominal pain that started yesterday. He was seen in the office and thought it was related to a UTI or stone. He was seen on 05/11/20 and given Rocephin 1 mg Injection and Keflex 500 mg. He reports he was feeling better until last night.   Denies any hematuria, dysuria, or flank pain.   States he is having frequency, fever 100-101 F that has resolved now. He reports lower left abdominal  pain of 8 out 10 that comes and goes.   ROS   Observations/Objective: No SOB or distress noted   Assessment and Plan: 1. Left lower quadrant abdominal pain - ciprofloxacin (CIPRO) 500 MG tablet; Take 1 tablet (500 mg total) by mouth 2 (two) times daily.  Dispense: 14 tablet; Refill: 0 - metroNIDAZOLE (FLAGYL) 500 MG  tablet; Take 1 tablet (500 mg total) by mouth 2 (two) times daily.  Dispense: 14 tablet; Refill: 0  2. Fever, unspecified fever cause  3. Diverticulitis - ciprofloxacin (CIPRO) 500 MG tablet; Take 1 tablet (500 mg total) by mouth 2 (two) times daily.  Dispense: 14 tablet; Refill: 0 - metroNIDAZOLE (FLAGYL) 500 MG tablet; Take 1 tablet (500 mg total) by mouth 2 (two) times daily.  Dispense: 14 tablet; Refill: 0  Discussed red flags to go to ED!!  He states if symptoms worsen or do not improve in next 24 hours he will go to ED.  Clear liquid diet      I discussed the assessment and treatment plan with the patient. The patient was provided an opportunity to ask questions and all were answered. The patient agreed with the plan and demonstrated an understanding of the instructions.   The patient was advised to call back or seek an in-person evaluation if the symptoms worsen or if the condition fails to improve as anticipated.  The above assessment and management plan was discussed with the patient. The patient verbalized understanding of and has agreed to the management plan. Patient is aware to call the clinic if symptoms persist or worsen. Patient is aware when to return to the clinic for a follow-up visit. Patient educated on when it is appropriate to go to the emergency department.   Time call ended:  12:17 pm   I provided 11 minutes of non-face-to-face time during this encounter.    Jannifer Rodney, FNP

## 2020-06-12 ENCOUNTER — Other Ambulatory Visit: Payer: Self-pay

## 2020-06-12 DIAGNOSIS — Z20822 Contact with and (suspected) exposure to covid-19: Secondary | ICD-10-CM

## 2020-06-16 LAB — SPECIMEN STATUS REPORT

## 2020-06-16 LAB — NOVEL CORONAVIRUS, NAA: SARS-CoV-2, NAA: NOT DETECTED

## 2020-06-29 ENCOUNTER — Other Ambulatory Visit: Payer: Self-pay | Admitting: Internal Medicine

## 2020-06-29 ENCOUNTER — Other Ambulatory Visit: Payer: Self-pay

## 2020-06-29 DIAGNOSIS — Z20822 Contact with and (suspected) exposure to covid-19: Secondary | ICD-10-CM

## 2020-06-30 LAB — SARS-COV-2, NAA 2 DAY TAT

## 2020-06-30 LAB — SPECIMEN STATUS REPORT

## 2020-06-30 LAB — NOVEL CORONAVIRUS, NAA: SARS-CoV-2, NAA: NOT DETECTED

## 2020-07-31 ENCOUNTER — Other Ambulatory Visit: Payer: Self-pay

## 2020-07-31 ENCOUNTER — Encounter: Payer: Self-pay | Admitting: Nurse Practitioner

## 2020-07-31 ENCOUNTER — Ambulatory Visit: Payer: PRIVATE HEALTH INSURANCE | Admitting: Nurse Practitioner

## 2020-07-31 VITALS — BP 131/78 | HR 75 | Temp 96.4°F | Ht 74.0 in | Wt 238.4 lb

## 2020-07-31 DIAGNOSIS — E7800 Pure hypercholesterolemia, unspecified: Secondary | ICD-10-CM | POA: Diagnosis not present

## 2020-07-31 DIAGNOSIS — I10 Essential (primary) hypertension: Secondary | ICD-10-CM | POA: Diagnosis not present

## 2020-07-31 DIAGNOSIS — J301 Allergic rhinitis due to pollen: Secondary | ICD-10-CM

## 2020-07-31 DIAGNOSIS — Z1211 Encounter for screening for malignant neoplasm of colon: Secondary | ICD-10-CM

## 2020-07-31 DIAGNOSIS — Z6830 Body mass index (BMI) 30.0-30.9, adult: Secondary | ICD-10-CM

## 2020-07-31 DIAGNOSIS — Z1212 Encounter for screening for malignant neoplasm of rectum: Secondary | ICD-10-CM

## 2020-07-31 LAB — CBC WITH DIFFERENTIAL/PLATELET
Eos: 2 %
Hematocrit: 49.1 % (ref 37.5–51.0)
MCV: 88 fL (ref 79–97)
Monocytes Absolute: 0.6 10*3/uL (ref 0.1–0.9)

## 2020-07-31 LAB — CMP14+EGFR

## 2020-07-31 MED ORDER — FLUTICASONE PROPIONATE 50 MCG/ACT NA SUSP: 2.0000 | Freq: Every day | NASAL | 6 refills | Status: DC

## 2020-07-31 MED ORDER — LISINOPRIL 20 MG PO TABS: 20.0000 mg | ORAL_TABLET | Freq: Every day | ORAL | 1 refills | Status: DC

## 2020-07-31 NOTE — Patient Instructions (Signed)

## 2020-07-31 NOTE — Addendum Note (Signed)
Addended by: Chevis Pretty on: 07/31/2020 12:03 PM   Modules accepted: Orders

## 2020-07-31 NOTE — Progress Notes (Signed)
Subjective:    Patient ID: Allen Herring, male    DOB: April 09, 1961, 60 y.o.   MRN: [id number]   Chief Complaint: medical management of chronic issues     HPI:  1. Primary hypertension No c/o chest ain, sob or headache. Does not check blood pressure at home. BP Readings from Last 3 Encounters:  07/31/20 131/78  04/21/20 (!) 152/100  03/02/20 (!) 144/99      2. Pure hypercholesterolemia Does try to watch diet but does no dedicated exercise. Lab Results  Component Value Date   CHOL 188 03/02/2020   HDL 29 (L) 03/02/2020   LDLCALC 116 (H) 03/02/2020   TRIG 244 (H) 03/02/2020   CHOLHDL 6.5 (H) 03/02/2020   The 10-year ASCVD risk score Mikey Bussing DC Jr., et al., 2013) is: 14.5%   3. Seasonal allergic rhinitis due to pollen Uses flonase as needed. currently doing well.  4. BMI 30.0-30.9,adult Weight down 6lbs since last visit Wt Readings from Last 3 Encounters:  07/31/20 238 lb 6.4 oz (108.1 kg)  04/21/20 247 lb (112 kg)  03/02/20 245 lb (111.1 kg)   BMI Readings from Last 3 Encounters:  07/31/20 30.61 kg/m  04/21/20 31.71 kg/m  03/02/20 31.46 kg/m       Outpatient Encounter Medications as of 07/31/2020  Medication Sig      . fluticasone (FLONASE) 50 MCG/ACT nasal spray Place 2 sprays into both nostrils daily.  Marland Kitchen lisinopril (ZESTRIL) 20 MG tablet Take 1 tablet (20 mg total) by mouth daily.     Past Surgical History:  Procedure Laterality Date  . CERVICAL SPINE SURGERY  05/2016  . Dental extractions      Family History  Problem Relation Age of Onset  . Cancer Maternal Grandfather        head and neck cancer  . Fibromyalgia Sister   . Hypertension Brother     New complaints: No complaints  Social history: Lives with his girlfriend  Controlled substance contract: n/a    Review of Systems  Constitutional: Negative for diaphoresis.  Eyes: Negative for pain.  Respiratory: Negative for shortness of breath.   Cardiovascular: Negative for chest  pain, palpitations and leg swelling.  Gastrointestinal: Negative for abdominal pain.  Endocrine: Negative for polydipsia.  Skin: Negative for rash.  Neurological: Negative for dizziness, weakness and headaches.  Hematological: Does not bruise/bleed easily.  All other systems reviewed and are negative.      Objective:   Physical Exam Vitals and nursing note reviewed.  Constitutional:      Appearance: Normal appearance. He is well-developed and well-nourished.  HENT:     Head: Normocephalic.     Nose: Nose normal.     Mouth/Throat:     Mouth: Oropharynx is clear and moist.  Eyes:     Extraocular Movements: EOM normal.     Pupils: Pupils are equal, round, and reactive to light.  Neck:     Thyroid: No thyroid mass or thyromegaly.     Vascular: No carotid bruit or JVD.     Trachea: Phonation normal.  Cardiovascular:     Rate and Rhythm: Normal rate and regular rhythm.  Pulmonary:     Effort: Pulmonary effort is normal. No respiratory distress.     Breath sounds: Normal breath sounds.  Abdominal:     General: Bowel sounds are normal. Aorta is normal.     Palpations: Abdomen is soft.     Tenderness: There is no abdominal tenderness.     Hernia: A  hernia (abdominal wall hernia) is present.  Musculoskeletal:        General: Normal range of motion.     Cervical back: Normal range of motion and neck supple.  Lymphadenopathy:     Cervical: No cervical adenopathy.  Skin:    General: Skin is warm and dry.  Neurological:     Mental Status: He is alert and oriented to person, place, and time.  Psychiatric:        Mood and Affect: Mood and affect normal.        Behavior: Behavior normal.        Thought Content: Thought content normal.        Judgment: Judgment normal.    BP 131/78   Pulse 75   Temp (!) 96.4 F (35.8 C)   Ht 6\' 2"  (1.88 m)   Wt 238 lb 6.4 oz (108.1 kg)   SpO2 95%   BMI 30.61 kg/m         Assessment & Plan:  Allen Herring comes in today with chief  complaint of Medication Refill   Diagnosis and orders addressed:  1. Primary hypertension Low sodium diet - lisinopril (ZESTRIL) 20 MG tablet; Take 1 tablet (20 mg total) by mouth daily.  Dispense: 90 tablet; Refill: 1  2. Pure hypercholesterolemia Low fat diet  3. Seasonal allergic rhinitis due to pollen - fluticasone (FLONASE) 50 MCG/ACT nasal spray; Place 2 sprays into both nostrils daily.  Dispense: 16 g; Refill: 6  4. BMI 30.0-30.9,adult Discussed diet and exercise for person with BMI >25 Will recheck weight in 3-6 months  5. Encounter for screening for colorectal malignant neoplasm Reorder cologiuard since did not receive last time ordered - Cologuard   Labs pending Health Maintenance reviewed Diet and exercise encouraged  Follow up plan: 6 months   [place], FNP

## 2020-08-01 LAB — CBC WITH DIFFERENTIAL/PLATELET
Basophils Absolute: 0.1 10*3/uL (ref 0.0–0.2)
Basos: 1 %
EOS (ABSOLUTE): 0.1 10*3/uL (ref 0.0–0.4)
Hemoglobin: 16.8 g/dL (ref 13.0–17.7)
Immature Grans (Abs): 0 10*3/uL (ref 0.0–0.1)
Immature Granulocytes: 0 %
Lymphocytes Absolute: 1.5 10*3/uL (ref 0.7–3.1)
Lymphs: 23 %
MCH: 30.1 pg (ref 26.6–33.0)
MCHC: 34.2 g/dL (ref 31.5–35.7)
Monocytes: 8 %
Neutrophils Absolute: 4.3 10*3/uL (ref 1.4–7.0)
Neutrophils: 66 %
Platelets: 238 10*3/uL (ref 150–450)
RBC: 5.59 x10E6/uL (ref 4.14–5.80)
RDW: 14.8 % (ref 11.6–15.4)
WBC: 6.6 10*3/uL (ref 3.4–10.8)

## 2020-08-01 LAB — CMP14+EGFR
AST: 22 IU/L (ref 0–40)
Albumin/Globulin Ratio: 1.8 (ref 1.2–2.2)
Albumin: 4.7 g/dL (ref 3.8–4.9)
Alkaline Phosphatase: 61 IU/L (ref 44–121)
BUN/Creatinine Ratio: 18 (ref 10–24)
BUN: 16 mg/dL (ref 8–27)
CO2: 23 mmol/L (ref 20–29)
Chloride: 103 mmol/L (ref 96–106)
Creatinine, Ser: 0.87 mg/dL (ref 0.76–1.27)
GFR calc Af Amer: 108 mL/min/{1.73_m2} (ref >59)
Globulin, Total: 2.6 g/dL (ref 1.5–4.5)
Glucose: 88 mg/dL (ref 65–99)
Total Protein: 7.3 g/dL (ref 6.0–8.5)

## 2020-08-01 LAB — LIPID PANEL
Chol/HDL Ratio: 6.2 ratio — ABNORMAL HIGH (ref 0.0–5.0)
Cholesterol, Total: 173 mg/dL (ref 100–199)
HDL: 28 mg/dL — ABNORMAL LOW (ref >39)
LDL Chol Calc (NIH): 104 mg/dL — ABNORMAL HIGH (ref 0–99)
Triglycerides: 234 mg/dL — ABNORMAL HIGH (ref 0–149)
VLDL Cholesterol Cal: 41 mg/dL — ABNORMAL HIGH (ref 5–40)

## 2020-12-11 ENCOUNTER — Other Ambulatory Visit: Payer: Self-pay | Admitting: Nurse Practitioner

## 2020-12-11 DIAGNOSIS — I10 Essential (primary) hypertension: Secondary | ICD-10-CM

## 2021-01-29 ENCOUNTER — Other Ambulatory Visit: Payer: Self-pay

## 2021-01-29 ENCOUNTER — Ambulatory Visit: Payer: No Typology Code available for payment source | Admitting: Nurse Practitioner

## 2021-01-29 ENCOUNTER — Encounter: Payer: Self-pay | Admitting: Nurse Practitioner

## 2021-01-29 VITALS — BP 121/83 | HR 92 | Temp 96.8°F | Resp 20 | Ht 74.0 in | Wt 239.0 lb

## 2021-01-29 DIAGNOSIS — M545 Low back pain, unspecified: Secondary | ICD-10-CM | POA: Diagnosis not present

## 2021-01-29 DIAGNOSIS — E7800 Pure hypercholesterolemia, unspecified: Secondary | ICD-10-CM

## 2021-01-29 DIAGNOSIS — I10 Essential (primary) hypertension: Secondary | ICD-10-CM | POA: Diagnosis not present

## 2021-01-29 DIAGNOSIS — Z6830 Body mass index (BMI) 30.0-30.9, adult: Secondary | ICD-10-CM

## 2021-01-29 MED ORDER — LISINOPRIL 20 MG PO TABS: 20.0000 mg | ORAL_TABLET | Freq: Every day | ORAL | 1 refills | Status: DC

## 2021-01-29 MED ORDER — NAPROXEN 500 MG PO TABS: 500.0000 mg | ORAL_TABLET | Freq: Two times a day (BID) | ORAL | 0 refills | Status: DC

## 2021-01-29 MED ORDER — CYCLOBENZAPRINE HCL 10 MG PO TABS: 10.0000 mg | ORAL_TABLET | Freq: Three times a day (TID) | ORAL | 0 refills | Status: DC | PRN

## 2021-01-29 NOTE — Patient Instructions (Signed)
  Cologuard  Your provider has prescribed Cologuard, an easy-to-use, noninvasive test for colon cancer screening, based on the latest advances in stool DNA science.   Here's what will happen next:  1. You may receive a call or email from [facility] to confirm your mailing address and insurance information 2. Your kit will be shipped directly to you 3. You collect your stool sample in the privacy of your own home. Please follow the instructions that come with the kit 4. You return the kit via [facility] shipping or pick-up, in the same box it arrived in 5. You should receive a call with the results once they are available. If you do not receive a call, please contact our office at [phone]  Insurance Coverage  Cologuard is covered by Medicare and most major insurers Cologuard is covered by Medicare and Medicare Advantage with no co-pay or deductible for eligible patients ages 70-85. [employer], more than 94% of Cologuard patients have no out-of-pocket cost for screening. Based on the [place] should be covered by most private insurers with no co-pay or deductible for eligible patients (ages 85-75; at average risk for colon cancer; without symptoms). Currently, ~74% of Cologuard patients 45-49 have had no out-of-pocket cost for screening.  Many national and regional payers have begun paying for CRC screening at 74. [employer] continues to work with payers to expand coverage and access for patients ages 2-49.   Only your healthcare insurance provider can confirm how Cologuard will be covered for you. If you have questions about coverage, you can contact your insurance company directly or ask the specialists at [employer] to do that for you. A Customer Support Specialist can be reached at [phone].    Patient Support Screening for colon cancer is very important to your good health, so if you have any questions at all, please call [facility]  Customer Support Specialists at [phone]. They are available 24 hours a day, 6 days a week. An instructional video is available to view online at [email or web address]   *Information and graphics obtained from [email or web address]

## 2021-01-29 NOTE — Progress Notes (Signed)
Subjective:    Patient ID: Allen Herring, male    DOB: [birth year], 60 y.o.   MRN: [id number]   Chief Complaint: Medical Management of Chronic Issues    HPI:  1. Primary hypertension No c/o chest pain, sob or headache. Does not check blood pressure at h ome. BP Readings from Last 3 Encounters:  01/29/21 121/83  07/31/20 131/78  04/21/20 (!) 152/100     2. Pure hypercholesterolemia Does not watch diet and does no dedicated exercise. He has refused statin therapy. Lab Results  Component Value Date   CHOL 173 07/31/2020   HDL 28 (L) 07/31/2020   LDLCALC 104 (H) 07/31/2020   TRIG 234 (H) 07/31/2020   CHOLHDL 6.2 (H) 07/31/2020     3. BMI 30.0-30.9,adult No recent weight changes Wt Readings from Last 3 Encounters:  01/29/21 239 lb (108.4 kg)  07/31/20 238 lb 6.4 oz (108.1 kg)  04/21/20 247 lb (112 kg)   BMI Readings from Last 3 Encounters:  01/29/21 30.69 kg/m  07/31/20 30.61 kg/m  04/21/20 31.71 kg/m       Outpatient Encounter Medications as of 01/29/2021  Medication Sig   fluticasone (FLONASE) 50 MCG/ACT nasal spray Place 2 sprays into both nostrils daily.   lisinopril (ZESTRIL) 20 MG tablet Take 1 tablet by mouth once daily   No facility-administered encounter medications on file as of 01/29/2021.    Past Surgical History:  Procedure Laterality Date   CERVICAL SPINE SURGERY  05/2016   Dental extractions      Family History  Problem Relation Age of Onset   Cancer Maternal Grandfather        head and neck cancer   Fibromyalgia Sister    Hypertension Brother     New complaints: C/o back pain for 2 weeks. Pulled something at work. Rates pain 5/10. Stays in right mid back. Moving or heavy lifting increases pain.  Social history: Allen Herring over [place] world fro work. Is going to work 3 more years.  Controlled substance contract: n/a     Review of Systems  Constitutional:  Negative for diaphoresis.  Eyes:  Negative for pain.  Respiratory:   Negative for shortness of breath.   Cardiovascular:  Negative for chest pain, palpitations and leg swelling.  Gastrointestinal:  Negative for abdominal pain.  Endocrine: Negative for polydipsia.  Skin:  Negative for rash.  Neurological:  Negative for dizziness, weakness and headaches.  Hematological:  Does not bruise/bleed easily.  All other systems reviewed and are negative.     Objective:   Physical Exam Vitals and nursing note reviewed.  Constitutional:      Appearance: Normal appearance. He is well-developed.  HENT:     Head: Normocephalic.     Nose: Nose normal.  Eyes:     Pupils: Pupils are equal, round, and reactive to light.  Neck:     Thyroid: No thyroid mass or thyromegaly.     Vascular: No carotid bruit or JVD.     Trachea: Phonation normal.  Cardiovascular:     Rate and Rhythm: Normal rate and regular rhythm.  Pulmonary:     Effort: Pulmonary effort is normal. No respiratory distress.     Breath sounds: Normal breath sounds.  Abdominal:     General: Bowel sounds are normal.     Palpations: Abdomen is soft.     Tenderness: There is no abdominal tenderness.  Musculoskeletal:        General: Normal range of motion.  Cervical back: Normal range of motion and neck supple.     Comments: FROM of lumbar spine without pain Motor strength and sensation distally intact. (-) SLR bil.  Lymphadenopathy:     Cervical: No cervical adenopathy.  Skin:    General: Skin is warm and dry.  Neurological:     Mental Status: He is alert and oriented to person, place, and time.  Psychiatric:        Behavior: Behavior normal.        Thought Content: Thought content normal.        Judgment: Judgment normal.    BP 121/83   Pulse 92   Temp (!) 96.8 F (36 C) (Temporal)   Resp 20   Ht '6\' 2"'$  (1.88 m)   Wt 239 lb (108.4 kg)   SpO2 97%   BMI 30.69 kg/m        Assessment & Plan:  [name] comes in today with chief complaint of Medical Management of Chronic  Issues   Diagnosis and orders addressed:  1. Primary hypertension Low sodium diet - lisinopril (ZESTRIL) 20 MG tablet; Take 1 tablet (20 mg total) by mouth daily.  Dispense: 90 tablet; Refill: 1  2. Pure hypercholesterolemia Low fat diet Refuses statin  3. BMI 30.0-30.9,adult Discussed diet and exercise for person with BMI >25 Will recheck weight in 3-6 months   4. Acute midline low back pain without sciatica Moist heat rest - naproxen (NAPROSYN) 500 MG tablet; Take 1 tablet (500 mg total) by mouth 2 (two) times daily with a meal.  Dispense: 60 tablet; Refill: 0 - cyclobenzaprine (FLEXERIL) 10 MG tablet; Take 1 tablet (10 mg total) by mouth 3 (three) times daily as needed for muscle spasms.  Dispense: 30 tablet; Refill: 0   Labs pending Health Maintenance reviewed- cologuard ordered Diet and exercise encouraged  Follow up plan: 6 months   [place], FNP

## 2021-04-27 ENCOUNTER — Other Ambulatory Visit: Payer: Self-pay | Admitting: Nurse Practitioner

## 2021-04-27 DIAGNOSIS — I10 Essential (primary) hypertension: Secondary | ICD-10-CM

## 2021-05-09 IMAGING — DX DG ABDOMEN 1V
2 series · 2 of 2 positions shown · non-contrast
Comparison: CT Abdomen and Pelvis 12/11/2017.

CLINICAL DATA: 59-year-old male with gross hematuria.

EXAM:
ABDOMEN - 1 VIEW

[abdomen kub (1 of 2)]
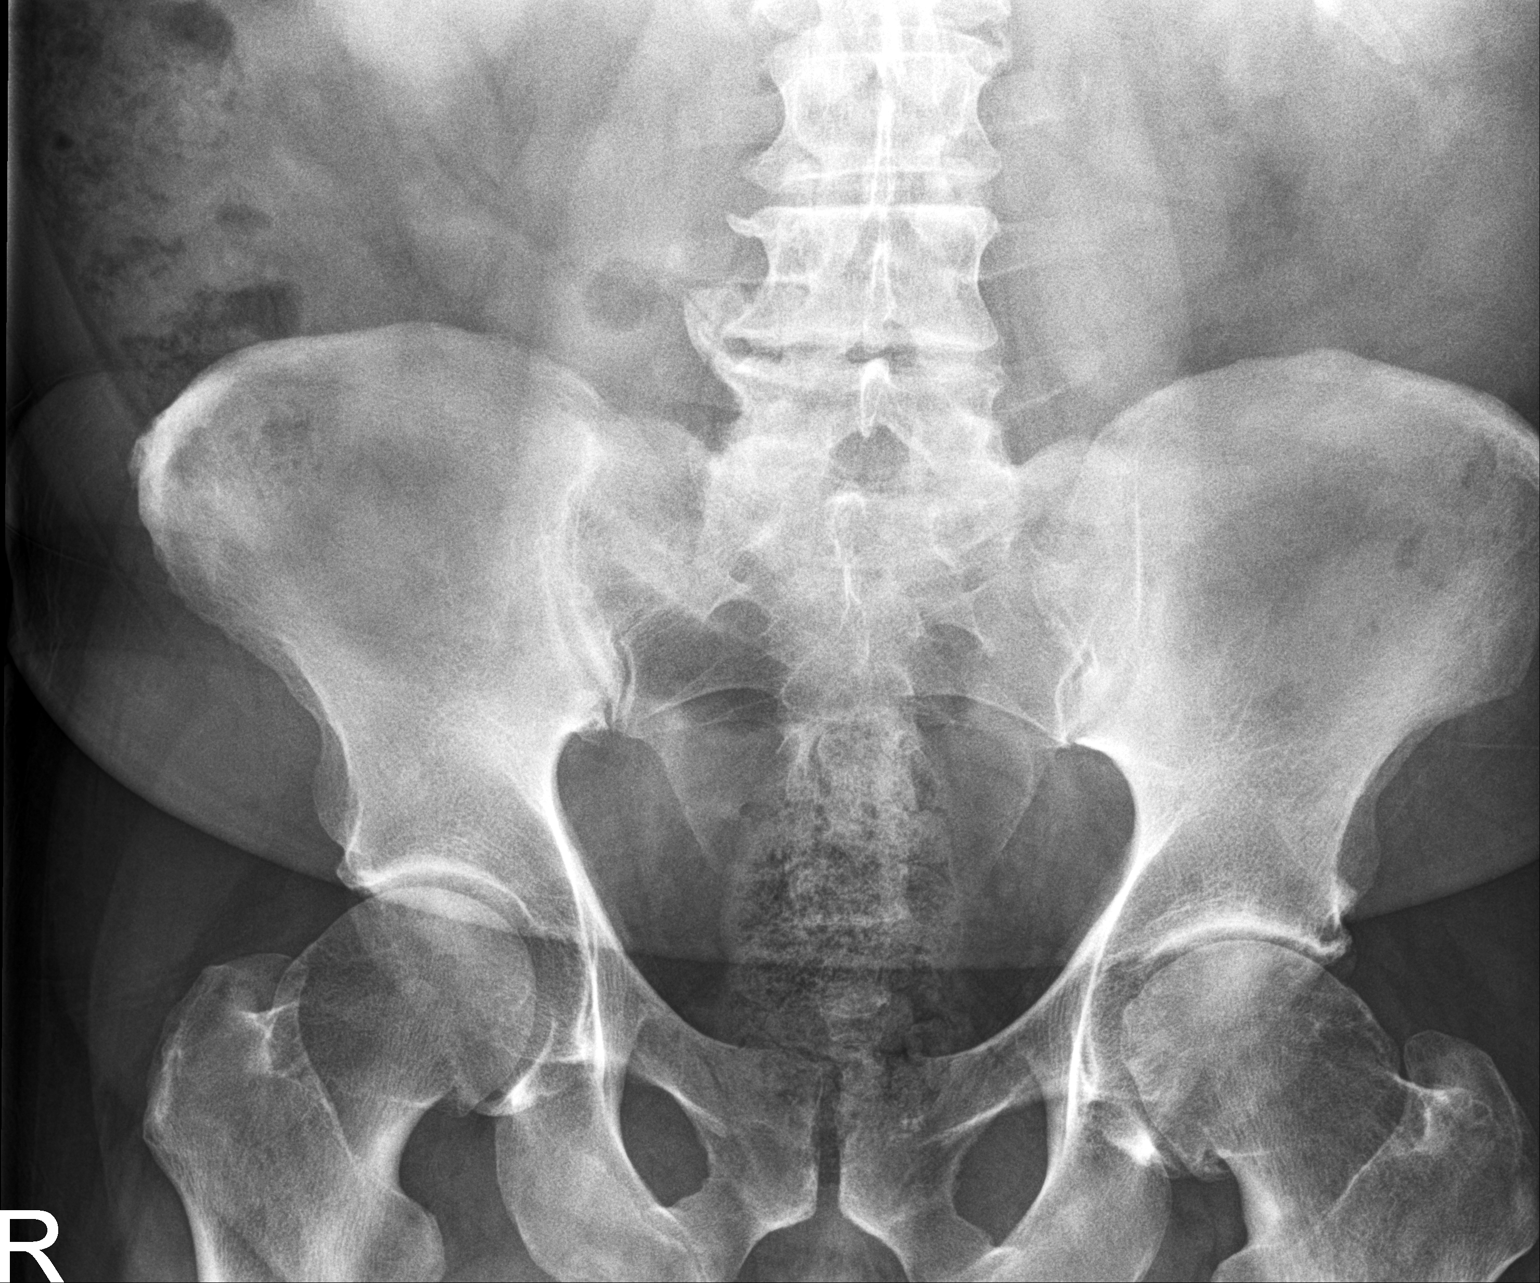

[abdomen kub (2 of 2)]
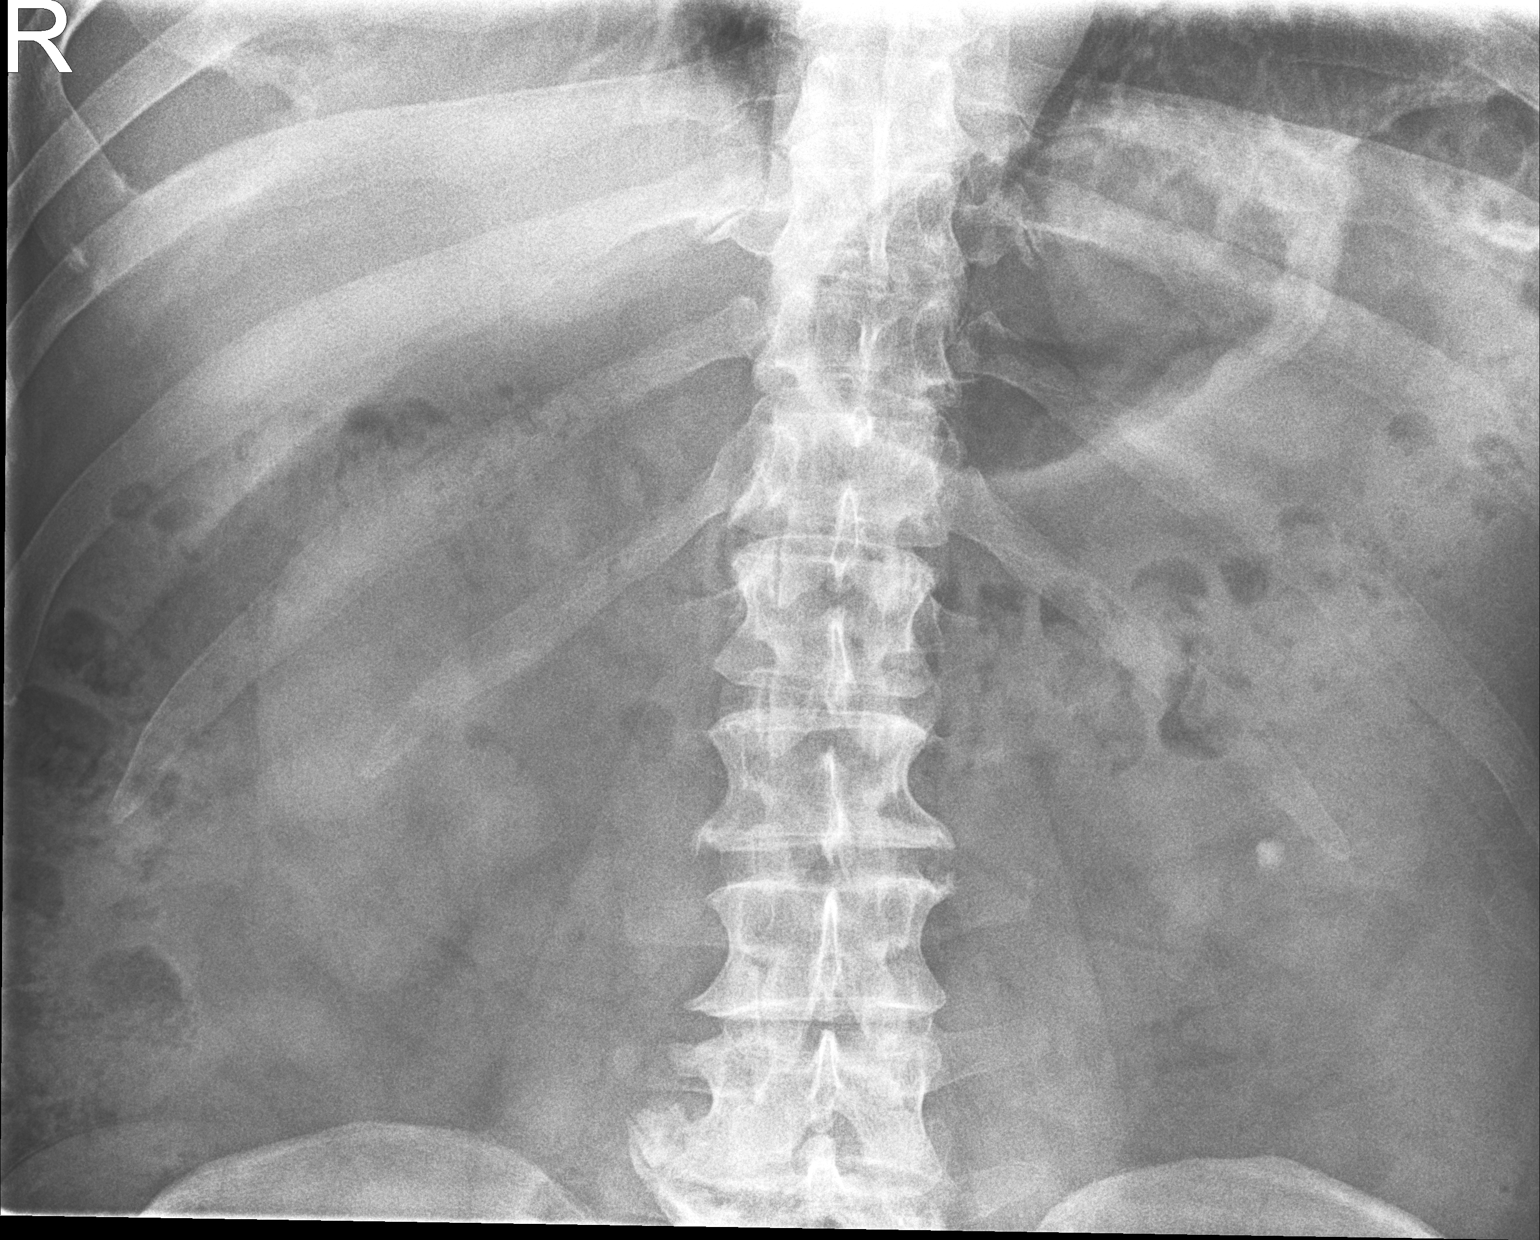

[2 of 2 positions shown; findings below may reference images not displayed]

FINDINGS: Round 6-7 mm right lower pole region calculus, possibly new since
the prior CT although motion artifact at that level on the prior
exam. No other nephrolithiasis identified. No suspicious pelvic
calcifications.

Superimposed nonobstructed bowel-gas pattern. Mild to moderate
retained stool in the rectum.

Degenerative changes in the spine and at the hips. No acute osseous
abnormality identified.
IMPRESSION: Lower pole 6-7 mm right nephrolithiasis. No other urinary calculus
identified radiographically.

If obstructive uropathy is suspected noncontrast CT Abdomen and
Pelvis would best evaluate further.

## 2021-07-26 ENCOUNTER — Other Ambulatory Visit: Payer: Self-pay | Admitting: Nurse Practitioner

## 2021-07-26 DIAGNOSIS — I10 Essential (primary) hypertension: Secondary | ICD-10-CM

## 2022-05-15 ENCOUNTER — Emergency Department (HOSPITAL_COMMUNITY): Payer: No Typology Code available for payment source

## 2022-05-15 ENCOUNTER — Other Ambulatory Visit: Payer: Self-pay

## 2022-05-15 ENCOUNTER — Emergency Department (HOSPITAL_COMMUNITY)
Admission: EM | Admit: 2022-05-15 | Discharge: 2022-05-15 | Disposition: A | Discharge status: Home / Self Care | Payer: No Typology Code available for payment source | Attending: Emergency Medicine | Admitting: Emergency Medicine

## 2022-05-15 DIAGNOSIS — R1011 Right upper quadrant pain: Secondary | ICD-10-CM | POA: Diagnosis not present

## 2022-05-15 DIAGNOSIS — R1012 Left upper quadrant pain: Secondary | ICD-10-CM | POA: Insufficient documentation

## 2022-05-15 DIAGNOSIS — R1010 Upper abdominal pain, unspecified: Secondary | ICD-10-CM

## 2022-05-15 DIAGNOSIS — K838 Other specified diseases of biliary tract: Secondary | ICD-10-CM | POA: Diagnosis not present

## 2022-05-15 DIAGNOSIS — Z87891 Personal history of nicotine dependence: Secondary | ICD-10-CM | POA: Diagnosis not present

## 2022-05-15 DIAGNOSIS — R1013 Epigastric pain: Secondary | ICD-10-CM | POA: Diagnosis present

## 2022-05-15 DIAGNOSIS — Z79899 Other long term (current) drug therapy: Secondary | ICD-10-CM | POA: Diagnosis not present

## 2022-05-15 DIAGNOSIS — I10 Essential (primary) hypertension: Secondary | ICD-10-CM | POA: Diagnosis not present

## 2022-05-15 LAB — COMPREHENSIVE METABOLIC PANEL
ALT: 26 U/L (ref 0–44)
AST: 22 U/L (ref 15–41)
Albumin: 4.1 g/dL (ref 3.5–5.0)
Alkaline Phosphatase: 49 U/L (ref 38–126)
Anion gap: 7 (ref 5–15)
BUN: 13 mg/dL (ref 8–23)
CO2: 26 mmol/L (ref 22–32)
Calcium: 9.1 mg/dL (ref 8.9–10.3)
Chloride: 103 mmol/L (ref 98–111)
Creatinine, Ser: 0.99 mg/dL (ref 0.61–1.24)
GFR, Estimated: >60 mL/min (ref >60)
Glucose, Bld: 112 mg/dL — ABNORMAL HIGH (ref 70–99)
Potassium: 3.5 mmol/L (ref 3.5–5.1)
Sodium: 136 mmol/L (ref 135–145)
Total Bilirubin: 0.9 mg/dL (ref 0.3–1.2)
Total Protein: 7.3 g/dL (ref 6.5–8.1)

## 2022-05-15 LAB — CBC WITH DIFFERENTIAL/PLATELET
Abs Immature Granulocytes: 0.03 10*3/uL (ref 0.00–0.07)
Basophils Absolute: 0.1 10*3/uL (ref 0.0–0.1)
Basophils Relative: 1 %
Eosinophils Absolute: 0.1 10*3/uL (ref 0.0–0.5)
Eosinophils Relative: 1 %
HCT: 49.8 % (ref 39.0–52.0)
Hemoglobin: 17.2 g/dL — ABNORMAL HIGH (ref 13.0–17.0)
Immature Granulocytes: 0 %
Lymphocytes Relative: 11 %
Lymphs Abs: 1.1 10*3/uL (ref 0.7–4.0)
MCH: 29.9 pg (ref 26.0–34.0)
MCHC: 34.5 g/dL (ref 30.0–36.0)
MCV: 86.6 fL (ref 80.0–100.0)
Monocytes Absolute: 0.7 10*3/uL (ref 0.1–1.0)
Monocytes Relative: 8 %
Neutro Abs: 7.3 10*3/uL (ref 1.7–7.7)
Neutrophils Relative %: 79 %
Platelets: 202 10*3/uL (ref 150–400)
RBC: 5.75 MIL/uL (ref 4.22–5.81)
RDW: 13.2 % (ref 11.5–15.5)
WBC: 9.2 10*3/uL (ref 4.0–10.5)
nRBC: 0 % (ref 0.0–0.2)

## 2022-05-15 LAB — URINALYSIS, ROUTINE W REFLEX MICROSCOPIC
Bacteria, UA: NONE SEEN
Bilirubin Urine: NEGATIVE
Glucose, UA: NEGATIVE mg/dL
Ketones, ur: NEGATIVE mg/dL
Leukocytes,Ua: NEGATIVE
Nitrite: NEGATIVE
Protein, ur: NEGATIVE mg/dL
Specific Gravity, Urine: 1.015 (ref 1.005–1.030)
pH: 6 (ref 5.0–8.0)

## 2022-05-15 LAB — LIPASE, BLOOD: Lipase: 38 U/L (ref 11–51)

## 2022-05-15 MED ORDER — IOHEXOL 300 MG/ML  SOLN
100.0000 mL | Freq: Once | INTRAMUSCULAR | Status: AC | PRN
  Administered 2022-05-15: 100 mL via INTRAVENOUS

## 2022-05-15 MED ORDER — SODIUM CHLORIDE 0.9 % IV BOLUS
1000.0000 mL | Freq: Once | INTRAVENOUS | Status: AC
  Administered 2022-05-15: 1000 mL via INTRAVENOUS

## 2022-05-15 MED ORDER — PANTOPRAZOLE SODIUM 40 MG PO TBEC: 40.0000 mg | DELAYED_RELEASE_TABLET | Freq: Every day | ORAL | 0 refills | Status: DC

## 2022-05-15 MED ORDER — SUCRALFATE 1 GM/10ML PO SUSP: 1.0000 g | Freq: Three times a day (TID) | ORAL | 0 refills | Status: DC

## 2022-05-15 MED ORDER — FAMOTIDINE 20 MG PO TABS: 20.0000 mg | ORAL_TABLET | Freq: Two times a day (BID) | ORAL | 0 refills | Status: DC

## 2022-05-15 NOTE — ED Provider Notes (Signed)
[facility] EMERGENCY DEPARTMENT Provider Note   CSN: [id number] Arrival date & time: 05/15/22  7989     History  Chief Complaint  Patient presents with   Abdominal Cramping   Abdominal Pain    Allen Herring is a 61 y.o. male.  HPI 61 year old male with a past history of diverticulitis presents with abdominal pain.  Started 2 days ago.  Is primarily epigastric and feels sharp and is rated as an 8 out of 10.  Has been constant since it started.  Got about an hour relief after taking his girlfriends Protonix (2 tablets) but otherwise the pain has been constant.  He states that hurts as he is eating.  He has not had nausea, vomiting, diarrhea, chest pain, shortness of breath.  He states that it kind of feels similar to when he had diverticulitis but that was many years ago and so is not totally sure.  Some discomfort in his back. No prior abdominal surgeries. Has had a low grade fever, at one point up to 100.  Home Medications Prior to Admission medications   Medication Sig Start Date End Date Taking? Authorizing Provider  famotidine (PEPCID) 20 MG tablet Take 1 tablet (20 mg total) by mouth 2 (two) times daily. 05/15/22  Yes Sherwood Gambler, MD  pantoprazole (PROTONIX) 40 MG tablet Take 1 tablet (40 mg total) by mouth daily. 05/15/22  Yes Sherwood Gambler, MD  cyclobenzaprine (FLEXERIL) 10 MG tablet Take 1 tablet (10 mg total) by mouth 3 (three) times daily as needed for muscle spasms. 01/29/21   Hassell Done, Mary-Margaret, FNP  fluticasone (FLONASE) 50 MCG/ACT nasal spray Place 2 sprays into both nostrils daily. 07/31/20   Hassell Done, Mary-Margaret, FNP  lisinopril (ZESTRIL) 20 MG tablet Take 1 tablet (20 mg total) by mouth daily. (NEEDS TO BE SEEN BEFORE NEXT REFILL) 07/26/21   Chevis Pretty, FNP  naproxen (NAPROSYN) 500 MG tablet Take 1 tablet (500 mg total) by mouth 2 (two) times daily with a meal. 01/29/21   Chevis Pretty, FNP      Allergies    Patient has no known allergies.     Review of Systems   Review of Systems  Constitutional:  Positive for fever.  Respiratory:  Negative for cough and shortness of breath.   Gastrointestinal:  Positive for abdominal pain. Negative for diarrhea, nausea and vomiting.  Musculoskeletal:  Positive for back pain.    Physical Exam Updated Vital Signs BP (!) 168/102   Pulse 73   Temp 98.3 F (36.8 C) (Oral)   Resp 16   Ht '6\' 2"'$  (1.88 m)   Wt 108.9 kg   SpO2 96%   BMI 30.81 kg/m  Physical Exam Vitals and nursing note reviewed.  Constitutional:      Appearance: He is well-developed.  HENT:     Head: Normocephalic and atraumatic.  Cardiovascular:     Rate and Rhythm: Normal rate and regular rhythm.     Heart sounds: Normal heart sounds.  Pulmonary:     Effort: Pulmonary effort is normal.     Breath sounds: Normal breath sounds.  Abdominal:     Palpations: Abdomen is soft.     Tenderness: There is generalized abdominal tenderness (patient has diffuse tenderness, but worst in epigastrum, followed by RUQ/LUQ. some mild lower tenderness) and tenderness in the epigastric area. There is right CVA tenderness. There is no left CVA tenderness.  Skin:    General: Skin is warm and dry.  Neurological:     Mental Status:  He is alert.     ED Results / Procedures / Treatments   Labs (all labs ordered are listed, but only abnormal results are displayed) Labs Reviewed  COMPREHENSIVE METABOLIC PANEL - Abnormal; Notable for the following components:      Result Value   Glucose, Bld 112 (*)    All other components within normal limits  URINALYSIS, ROUTINE W REFLEX MICROSCOPIC - Abnormal; Notable for the following components:   Hgb urine dipstick MODERATE (*)    All other components within normal limits  CBC WITH DIFFERENTIAL/PLATELET - Abnormal; Notable for the following components:   Hemoglobin 17.2 (*)    All other components within normal limits  LIPASE, BLOOD    EKG EKG Interpretation  Date/Time:  Sunday May 15 2022 08:01:57 EST Ventricular Rate:  74 PR Interval:  162 QRS Duration: 105 QT Interval:  401 QTC Calculation: 445 R Axis:   256 Text Interpretation: Sinus rhythm no acute ST/T changes No old tracing to compare Confirmed by Sherwood Gambler [id number]) on 05/15/2022 8:54:29 AM  Radiology US Abdomen Limited RUQ (LIVER/GB)  Result Date: 05/15/2022 CLINICAL DATA:  Right upper quadrant abdominal pain EXAM: ULTRASOUND ABDOMEN LIMITED RIGHT UPPER QUADRANT COMPARISON:  Abdominal CT from earlier today FINDINGS: Gallbladder: Gallbladder wall thickening with sludge but no stone or focal tenderness. Common bile duct: Diameter: 3 mm Liver: No focal lesion identified. Within normal limits in parenchymal echogenicity. Portal vein is patent on color Doppler imaging with normal direction of blood flow towards the liver. IMPRESSION: Gallbladder sludge and wall thickening but no focal tenderness or calculus typical of acute cholecystitis. Electronically Signed   By: Jorje Guild M.D.   On: 05/15/2022 11:40   CT ABDOMEN PELVIS W CONTRAST  Result Date: 05/15/2022 CLINICAL DATA:  Several day history of worsening mid sternal abdominal pain. EXAM: CT ABDOMEN AND PELVIS WITH CONTRAST TECHNIQUE: Multidetector CT imaging of the abdomen and pelvis was performed using the standard protocol following bolus administration of intravenous contrast. RADIATION DOSE REDUCTION: This exam was performed according to the departmental dose-optimization program which includes automated exposure control, adjustment of the mA and/or kV according to patient size and/or use of iterative reconstruction technique. CONTRAST:  121m OMNIPAQUE IOHEXOL 300 MG/ML  SOLN COMPARISON:  CT abdomen and pelvis dated 12/11/2017 FINDINGS: Lower chest: No focal consolidation or pulmonary nodule in the lung bases. No pleural effusion or pneumothorax demonstrated. Partially imaged heart size is normal. Hepatobiliary: 1.3 x 0.9 cm enhancing focus near the dome  in segment 7 (2:12) not seen on prior examination. No intra or extrahepatic biliary ductal dilation. Mild gallbladder wall edema and pericholecystic stranding. Pancreas: No focal lesions or main ductal dilation. Spleen: Normal in size without focal abnormality. Adrenals/Urinary Tract: No adrenal nodules. Nonobstructing stone in the left lower pole measures 10 mm. No hydronephrosis or suspicious renal masses. No focal bladder wall thickening. Stomach/Bowel: Small hiatal hernia. Normal appearance of the stomach. Duodenal diverticulum. No evidence of bowel wall thickening, distention, or inflammatory changes. Normal appendix. Colonic diverticulosis without acute diverticulitis. Vascular/Lymphatic: Aortic atherosclerosis. No enlarged abdominal or pelvic lymph nodes. Reproductive: Enlarged prostate gland. Other: No free fluid, fluid collection, or free air. Musculoskeletal: No acute or abnormal lytic or blastic osseous lesions. IMPRESSION: 1. Mild gallbladder wall edema and pericholecystic stranding, findings are suspicious for acute cholecystitis. Consider further evaluation with right upper quadrant ultrasound. 2. A 1.3 x 0.9 cm enhancing focus near the dome in segment 7 of the liver, not seen on prior examination. Findings are  nonspecific and could reflect a flash filling hemangioma or vascular shunt. Consider evaluation with nonemergent liver protocol MRI with and without contrast. 3. Nonobstructing stone in the left lower pole measures 10 mm. 4. Colonic diverticulosis without acute diverticulitis. 5. Enlarged prostate gland. 6. Small hiatal hernia. 7. Aortic Atherosclerosis (ICD10-I70.0). Electronically Signed   By: Darrin Nipper M.D.   On: 05/15/2022 09:29    Procedures Procedures    Medications Ordered in ED Medications  sodium chloride 0.9 % bolus 1,000 mL (0 mLs Intravenous Stopped 05/15/22 1320)  iohexol (OMNIPAQUE) 300 MG/ML solution 100 mL (100 mLs Intravenous Contrast Given 05/15/22 0850)    ED  Course/ Medical Decision Making/ A&P Clinical Course as of 05/15/22 1440  Sun May 15, 2022  1000 I discussed with Dr. Okey Dupre.  CT is concerning for cholecystitis though his other numbers look okay.  She would prefer to get an ultrasound. He does have focal right upper quadrant tenderness but otherwise appears well.  I was told we do not have ultrasound today besides for torsion but apparently we have an ultrasonographer from 9 AM-12 PM.  Thus we will get ultrasound and reconsult surgery afterwards. [SG]    Clinical Course User Index [SG] Sherwood Gambler, MD                           Medical Decision Making Amount and/or Complexity of Data Reviewed Labs: ordered.    Details: Normal WBC, LFTs, lipase. Radiology: ordered and independent interpretation performed.    Details: CT shows a nonspecific and mildly edematous gallbladder. Right upper quadrant ultrasound with some sludge but no stones. ECG/medicine tests: ordered and independent interpretation performed.    Details: No acute ischemia.  Risk Prescription drug management.   Patient has declined pain medicine while in the emergency department.  He has repeated upper abdominal tenderness including right upper quadrant.  At this point, CT was obtained as this person did not seem like we had ultrasound available.  Nonspecific gallbladder findings he does have right upper quadrant tenderness so I discussed with surgery as above.  Ultrasound was obtained and is also equivocal.  He continues to have tenderness but is otherwise well-appearing.  General surgery has evaluated and feels that he is stable for a HIDA scan tomorrow (not available today) and can go home with some Protonix as this could also be some gastritis.  We discussed return precautions but patient is comfortable with the plan to come back tomorrow for HIDA and Dr. Okey Dupre reports she will follow-up the results and follow-up patient.        Final Clinical Impression(s)  / ED Diagnoses Final diagnoses:  Upper abdominal pain    Rx / DC Orders ED Discharge Orders          Ordered    NM HEPATOBILIARY        05/15/22 1307    pantoprazole (PROTONIX) 40 MG tablet  Daily        05/15/22 1313    sucralfate (CARAFATE) 1 GM/10ML suspension  3 times daily with meals & bedtime,   Status:  Discontinued        05/15/22 1313    famotidine (PEPCID) 20 MG tablet  2 times daily        05/15/22 1323              Sherwood Gambler, MD 05/15/22 1441

## 2022-05-15 NOTE — Consult Note (Signed)
[facility] Consult  Reason for Consult: Epigastric abdominal pain, evaluate for gallbladder pathology Referring Physician: Dr. Regenia Skeeter  Chief Complaint   Abdominal Cramping; Abdominal Pain     HPI: Allen Herring is a 61 y.o. male who presents with epigastric abdominal pain that started about 2 days ago.  He initially thought that this was reflux related, and took his girlfriends Protonix, but it failed to improve, so he presented to the emergency department.  He does confirm nausea without any episodes of emesis.  His pain is in the epigastrium and does not radiate anywhere.  He denies having pains like this in the past.  His history significant for hypertension, GERD, and diverticulitis.  He denies any history of abdominal surgeries.  He denies use of blood thinning medications.  In the ED, he underwent a CT abdomen and pelvis which demonstrated mild gallbladder wall edema and pericholecystic stranding, suspicious for acute cholecystitis.  Abdominal ultrasound demonstrated gallbladder sludge and wall thickening but no focal tenderness or gallstones, less concerning for acute cholecystitis.  He has no leukocytosis, and LFTs are all within normal limits.  Past Medical History:  Diagnosis Date   GERD (gastroesophageal reflux disease)    Hypertension    Polycythemia 10/17/2011    Past Surgical History:  Procedure Laterality Date   CERVICAL SPINE SURGERY  05/2016   Dental extractions      Family History  Problem Relation Age of Onset   Cancer Maternal Grandfather        head and neck cancer   Fibromyalgia Sister    Hypertension Brother     Social History   Tobacco Use   Smoking status: Former    Types: Cigars   Smokeless tobacco: Never  Substance Use Topics   Alcohol use: No   Drug use: No    Medications: I have reviewed the patient's current medications.  No Known Allergies   ROS:  Pertinent items are noted in HPI.  Blood pressure (!) 175/105,  pulse 66, temperature 98.4 F (36.9 C), temperature source Oral, resp. rate 16, height '6\' 2"'$  (1.88 m), weight 108.9 kg, SpO2 97 %. Physical Exam Vitals reviewed.  Constitutional:      Appearance: He is well-developed.  HENT:     Head: Normocephalic and atraumatic.  Eyes:     Extraocular Movements: Extraocular movements intact.     Pupils: Pupils are equal, round, and reactive to light.  Cardiovascular:     Rate and Rhythm: Normal rate.  Pulmonary:     Effort: Pulmonary effort is normal.  Abdominal:     Comments: Abdomen soft, nondistended, no percussion tenderness, tenderness to palpation in epigastrium, no rigidity, guarding, rebound tenderness; negative Murphy sign  Skin:    General: Skin is warm and dry.  Neurological:     General: No focal deficit present.     Mental Status: He is alert and oriented to person, place, and time.  Psychiatric:        Mood and Affect: Mood normal.        Behavior: Behavior normal.     Results: Results for orders placed or performed during the hospital encounter of 05/15/22 (from the past 48 hour(s))  Comprehensive metabolic panel     Status: Abnormal   Collection Time: 05/15/22  8:00 AM  Result Value Ref Range   Sodium 136 135 - 145 mmol/L   Potassium 3.5 3.5 - 5.1 mmol/L   Chloride 103 98 - 111 mmol/L   CO2 26 22 -  32 mmol/L   Glucose, Bld 112 (H) 70 - 99 mg/dL    Comment: Glucose reference range applies only to samples taken after fasting for at least 8 hours.   BUN 13 8 - 23 mg/dL   Creatinine, Ser 0.99 0.61 - 1.24 mg/dL   Calcium 9.1 8.9 - 10.3 mg/dL   Total Protein 7.3 6.5 - 8.1 g/dL   Albumin 4.1 3.5 - 5.0 g/dL   AST 22 15 - 41 U/L   ALT 26 0 - 44 U/L   Alkaline Phosphatase 49 38 - 126 U/L   Total Bilirubin 0.9 0.3 - 1.2 mg/dL   GFR, Estimated >60 >60 mL/min    Comment: (NOTE) Calculated using the CKD-EPI Creatinine Equation (2021)    Anion gap 7 5 - 15    Comment: Performed at [facility], [address], [place],  [place] [address]  Lipase, blood     Status: None   Collection Time: 05/15/22  8:00 AM  Result Value Ref Range   Lipase 38 11 - 51 U/L    Comment: Performed at [facility], [address] [place], [place] [address]  CBC with Differential     Status: Abnormal   Collection Time: 05/15/22  8:00 AM  Result Value Ref Range   WBC 9.2 4.0 - 10.5 K/uL   RBC 5.75 4.22 - 5.81 MIL/uL   Hemoglobin 17.2 (H) 13.0 - 17.0 g/dL   HCT 49.8 39.0 - 52.0 %   MCV 86.6 80.0 - 100.0 fL   MCH 29.9 26.0 - 34.0 pg   MCHC 34.5 30.0 - 36.0 g/dL   RDW 13.2 11.5 - 15.5 %   Platelets 202 150 - 400 K/uL   nRBC 0.0 0.0 - 0.2 %   Neutrophils Relative % 79 %   Neutro Abs 7.3 1.7 - 7.7 K/uL   Lymphocytes Relative 11 %   Lymphs Abs 1.1 0.7 - 4.0 K/uL   Monocytes Relative 8 %   Monocytes Absolute 0.7 0.1 - 1.0 K/uL   Eosinophils Relative 1 %   Eosinophils Absolute 0.1 0.0 - 0.5 K/uL   Basophils Relative 1 %   Basophils Absolute 0.1 0.0 - 0.1 K/uL   Immature Granulocytes 0 %   Abs Immature Granulocytes 0.03 0.00 - 0.07 K/uL    Comment: Performed at [facility], [place], [address] [place], [place] [address]  Urinalysis, Routine w reflex microscopic Urine, Clean Catch     Status: Abnormal   Collection Time: 05/15/22  8:25 AM  Result Value Ref Range   Color, Urine YELLOW YELLOW   APPearance CLEAR CLEAR   Specific Gravity, Urine 1.015 1.005 - 1.030   pH 6.0 5.0 - 8.0   Glucose, UA NEGATIVE NEGATIVE mg/dL   Hgb urine dipstick MODERATE (A) NEGATIVE   Bilirubin Urine NEGATIVE NEGATIVE   Ketones, ur NEGATIVE NEGATIVE mg/dL   Protein, ur NEGATIVE NEGATIVE mg/dL   Nitrite NEGATIVE NEGATIVE   Leukocytes,Ua NEGATIVE NEGATIVE   RBC / HPF 0-5 0 - 5 RBC/hpf   WBC, UA 0-5 0 - 5 WBC/hpf   Bacteria, UA NONE SEEN NONE SEEN    Comment: Performed at [facility], [address] [place], [place] [address]    US Abdomen Limited RUQ (LIVER/GB)  Result Date: 05/15/2022 CLINICAL DATA:  Right upper quadrant abdominal pain EXAM: ULTRASOUND  ABDOMEN LIMITED RIGHT UPPER QUADRANT COMPARISON:  Abdominal CT from earlier today FINDINGS: Gallbladder: Gallbladder wall thickening with sludge but no stone or focal tenderness. Common bile duct:  Diameter: 3 mm Liver: No focal lesion identified. Within normal limits in parenchymal echogenicity. Portal vein is patent on color Doppler imaging with normal direction of blood flow towards the liver. IMPRESSION: Gallbladder sludge and wall thickening but no focal tenderness or calculus typical of acute cholecystitis. Electronically Signed   By: Jorje Guild M.D.   On: 05/15/2022 11:40   CT ABDOMEN PELVIS W CONTRAST  Result Date: 05/15/2022 CLINICAL DATA:  Several day history of worsening mid sternal abdominal pain. EXAM: CT ABDOMEN AND PELVIS WITH CONTRAST TECHNIQUE: Multidetector CT imaging of the abdomen and pelvis was performed using the standard protocol following bolus administration of intravenous contrast. RADIATION DOSE REDUCTION: This exam was performed according to the departmental dose-optimization program which includes automated exposure control, adjustment of the mA and/or kV according to patient size and/or use of iterative reconstruction technique. CONTRAST:  150m OMNIPAQUE IOHEXOL 300 MG/ML  SOLN COMPARISON:  CT abdomen and pelvis dated 12/11/2017 FINDINGS: Lower chest: No focal consolidation or pulmonary nodule in the lung bases. No pleural effusion or pneumothorax demonstrated. Partially imaged heart size is normal. Hepatobiliary: 1.3 x 0.9 cm enhancing focus near the dome in segment 7 (2:12) not seen on prior examination. No intra or extrahepatic biliary ductal dilation. Mild gallbladder wall edema and pericholecystic stranding. Pancreas: No focal lesions or main ductal dilation. Spleen: Normal in size without focal abnormality. Adrenals/Urinary Tract: No adrenal nodules. Nonobstructing stone in the left lower pole measures 10 mm. No hydronephrosis or suspicious renal masses. No focal bladder  wall thickening. Stomach/Bowel: Small hiatal hernia. Normal appearance of the stomach. Duodenal diverticulum. No evidence of bowel wall thickening, distention, or inflammatory changes. Normal appendix. Colonic diverticulosis without acute diverticulitis. Vascular/Lymphatic: Aortic atherosclerosis. No enlarged abdominal or pelvic lymph nodes. Reproductive: Enlarged prostate gland. Other: No free fluid, fluid collection, or free air. Musculoskeletal: No acute or abnormal lytic or blastic osseous lesions. IMPRESSION: 1. Mild gallbladder wall edema and pericholecystic stranding, findings are suspicious for acute cholecystitis. Consider further evaluation with right upper quadrant ultrasound. 2. A 1.3 x 0.9 cm enhancing focus near the dome in segment 7 of the liver, not seen on prior examination. Findings are nonspecific and could reflect a flash filling hemangioma or vascular shunt. Consider evaluation with nonemergent liver protocol MRI with and without contrast. 3. Nonobstructing stone in the left lower pole measures 10 mm. 4. Colonic diverticulosis without acute diverticulitis. 5. Enlarged prostate gland. 6. Small hiatal hernia. 7. Aortic Atherosclerosis (ICD10-I70.0). Electronically Signed   By: LDarrin NipperM.D.   On: 05/15/2022 09:29     Assessment & Plan:  TAum Caggianois a 61y.o. male who presents with a 2-day history of epigastric abdominal pain.  Ultrasound with wall thickening, no stones, sludge, less convincing for acute cholecystitis.  Imaging and blood work evaluated by myself.  -I discussed with the patient that while it is possible his gallbladder may be causing his pain, it is also possible that he could have gastritis  -I recommend obtaining a HIDA scan to fully rule in or out acute cholecystitis.  Unfortunately, we do not have nuclear medicine available on the weekend. -Given his normal blood work, it is reasonable for him to be discharged home with plan for outpatient HIDA -Patient is  agreeable to going home and following up in the next day or 2 for HIDA scan as an outpatient.  I explained that if the HIDA scan is positive for acute cholecystitis, then we will get him admitted to the hospital and  perform laparoscopic cholecystectomy.  If the HIDA scan does not demonstrate acute cholecystitis, he will follow-up with me in office, and we will schedule him for elective cholecystectomy. -I did explain that even after cholecystectomy, he may still have some residual pain, especially if his pain is related to gastritis/GERD -Recommend the patient adhere to a low-fat diet until HIDA scan -Will discharge the patient home with a small prescription for narcotic pain medication and Protonix -Plan discussed with Dr. Regenia Skeeter  All questions were answered to the satisfaction of the patient.  -- Graciella Freer, DO [facility] [address] [place], [place] [address] [phone] (office)

## 2022-05-15 NOTE — ED Triage Notes (Signed)
Pt c/o worsening mid sternal abdominal pain that started Friday evening. States he has a Hx of diverticulitis rates pain a 8/10

## 2022-05-15 NOTE — Discharge Instructions (Addendum)
Return to the [facility] tomorrow for HIDA scan. This will help differentiate if you are having abdominal pain from a gallbladder problem.  You are being prescribed Protonix to help with your abdominal pain in case this is from your stomach.  Follow-up with the general surgeon listed, she saw you today in the ER.  However, if at any point you develop fever, vomiting, new or worsening pain, or any other new/concerning symptoms then return to the ER for evaluation.

## 2022-05-16 ENCOUNTER — Other Ambulatory Visit (HOSPITAL_COMMUNITY): Payer: Self-pay | Admitting: Surgery

## 2022-05-16 DIAGNOSIS — K839 Disease of biliary tract, unspecified: Secondary | ICD-10-CM

## 2022-05-16 DIAGNOSIS — R1011 Right upper quadrant pain: Secondary | ICD-10-CM

## 2022-05-18 ENCOUNTER — Encounter (HOSPITAL_COMMUNITY): Payer: Self-pay

## 2022-05-18 ENCOUNTER — Telehealth (INDEPENDENT_AMBULATORY_CARE_PROVIDER_SITE_OTHER): Payer: No Typology Code available for payment source | Admitting: Surgery

## 2022-05-18 ENCOUNTER — Encounter (HOSPITAL_COMMUNITY)
Admission: RE | Admit: 2022-05-18 | Discharge: 2022-05-18 | Disposition: A | Discharge status: Home / Self Care | Payer: No Typology Code available for payment source | Source: Ambulatory Visit | Attending: General Surgery | Admitting: General Surgery

## 2022-05-18 ENCOUNTER — Encounter (HOSPITAL_COMMUNITY)
Admission: RE | Admit: 2022-05-18 | Discharge: 2022-05-18 | Disposition: A | Discharge status: Home / Self Care | Payer: No Typology Code available for payment source | Source: Ambulatory Visit | Attending: Surgery | Admitting: Surgery

## 2022-05-18 ENCOUNTER — Telehealth: Payer: Self-pay

## 2022-05-18 DIAGNOSIS — K839 Disease of biliary tract, unspecified: Secondary | ICD-10-CM

## 2022-05-18 DIAGNOSIS — R1011 Right upper quadrant pain: Secondary | ICD-10-CM

## 2022-05-18 DIAGNOSIS — R932 Abnormal findings on diagnostic imaging of liver and biliary tract: Secondary | ICD-10-CM | POA: Insufficient documentation

## 2022-05-18 DIAGNOSIS — Z01818 Encounter for other preprocedural examination: Secondary | ICD-10-CM | POA: Diagnosis not present

## 2022-05-18 DIAGNOSIS — K8000 Calculus of gallbladder with acute cholecystitis without obstruction: Secondary | ICD-10-CM

## 2022-05-18 MED ORDER — TECHNETIUM TC 99M MEBROFENIN IV KIT
5.0000 | PACK | Freq: Once | INTRAVENOUS | Status: AC | PRN
  Administered 2022-05-18: 5.5 via INTRAVENOUS

## 2022-05-18 MED ORDER — MORPHINE SULFATE (PF) 4 MG/ML IV SOLN
3.0000 mg | Freq: Once | INTRAVENOUS | Status: AC
  Administered 2022-05-18: 3 mg via INTRAVENOUS
  Filled 2022-05-18: qty 1

## 2022-05-18 NOTE — H&P (Signed)
[facility] History and Physical   Reason for Consult: Epigastric abdominal pain, evaluate for gallbladder pathology Referring Physician: Dr. Regenia Skeeter   Chief Complaint   Abdominal Cramping; Abdominal Pain        HPI: Allen Herring is a 61 y.o. male who presents with epigastric abdominal pain that started about 2 days ago.  He initially thought that this was reflux related, and took his girlfriends Protonix, but it failed to improve, so he presented to the emergency department.  He does confirm nausea without any episodes of emesis.  His pain is in the epigastrium and does not radiate anywhere.  He denies having pains like this in the past.  His history significant for hypertension, GERD, and diverticulitis.  He denies any history of abdominal surgeries.  He denies use of blood thinning medications.   In the ED, he underwent a CT abdomen and pelvis which demonstrated mild gallbladder wall edema and pericholecystic stranding, suspicious for acute cholecystitis.  Abdominal ultrasound demonstrated gallbladder sludge and wall thickening but no focal tenderness or gallstones, less concerning for acute cholecystitis.  He has no leukocytosis, and LFTs are all within normal limits.       Past Medical History:  Diagnosis Date   GERD (gastroesophageal reflux disease)     Hypertension     Polycythemia 10/17/2011           Past Surgical History:  Procedure Laterality Date   CERVICAL SPINE SURGERY   05/2016   Dental extractions               Family History  Problem Relation Age of Onset   Cancer Maternal Grandfather          head and neck cancer   Fibromyalgia Sister     Hypertension Brother        Social History         Tobacco Use   Smoking status: Former      Types: Cigars   Smokeless tobacco: Never  Substance Use Topics   Alcohol use: No   Drug use: No      Medications: I have reviewed the patient's current medications.   No Known Allergies     ROS:   Pertinent items are noted in HPI.   Blood pressure (!) 175/105, pulse 66, temperature 98.4 F (36.9 C), temperature source Oral, resp. rate 16, height '6\' 2"'$  (1.88 m), weight 108.9 kg, SpO2 97 %. Physical Exam Vitals reviewed.  Constitutional:      Appearance: He is well-developed.  HENT:     Head: Normocephalic and atraumatic.  Eyes:     Extraocular Movements: Extraocular movements intact.     Pupils: Pupils are equal, round, and reactive to light.  Cardiovascular:     Rate and Rhythm: Normal rate.  Pulmonary:     Effort: Pulmonary effort is normal.  Abdominal:     Comments: Abdomen soft, nondistended, no percussion tenderness, tenderness to palpation in epigastrium, no rigidity, guarding, rebound tenderness; negative Murphy sign  Skin:    General: Skin is warm and dry.  Neurological:     General: No focal deficit present.     Mental Status: He is alert and oriented to person, place, and time.  Psychiatric:        Mood and Affect: Mood normal.        Behavior: Behavior normal.        Results: Lab Results Last 48 Hours        Results for orders  placed or performed during the hospital encounter of 05/15/22 (from the past 48 hour(s))  Comprehensive metabolic panel     Status: Abnormal    Collection Time: 05/15/22  8:00 AM  Result Value Ref Range    Sodium 136 135 - 145 mmol/L    Potassium 3.5 3.5 - 5.1 mmol/L    Chloride 103 98 - 111 mmol/L    CO2 26 22 - 32 mmol/L    Glucose, Bld 112 (H) 70 - 99 mg/dL      Comment: Glucose reference range applies only to samples taken after fasting for at least 8 hours.    BUN 13 8 - 23 mg/dL    Creatinine, Ser 0.99 0.61 - 1.24 mg/dL    Calcium 9.1 8.9 - 10.3 mg/dL    Total Protein 7.3 6.5 - 8.1 g/dL    Albumin 4.1 3.5 - 5.0 g/dL    AST 22 15 - 41 U/L    ALT 26 0 - 44 U/L    Alkaline Phosphatase 49 38 - 126 U/L    Total Bilirubin 0.9 0.3 - 1.2 mg/dL    GFR, Estimated >60 >60 mL/min      Comment: (NOTE) Calculated using the  CKD-EPI Creatinine Equation (2021)      Anion gap 7 5 - 15      Comment: Performed at [facility], [address], [place], [place] [address]  Lipase, blood     Status: None    Collection Time: 05/15/22  8:00 AM  Result Value Ref Range    Lipase 38 11 - 51 U/L      Comment: Performed at [facility], [address], [place], [place] [address]  CBC with Differential     Status: Abnormal    Collection Time: 05/15/22  8:00 AM  Result Value Ref Range    WBC 9.2 4.0 - 10.5 K/uL    RBC 5.75 4.22 - 5.81 MIL/uL    Hemoglobin 17.2 (H) 13.0 - 17.0 g/dL    HCT 49.8 39.0 - 52.0 %    MCV 86.6 80.0 - 100.0 fL    MCH 29.9 26.0 - 34.0 pg    MCHC 34.5 30.0 - 36.0 g/dL    RDW 13.2 11.5 - 15.5 %    Platelets 202 150 - 400 K/uL    nRBC 0.0 0.0 - 0.2 %    Neutrophils Relative % 79 %    Neutro Abs 7.3 1.7 - 7.7 K/uL    Lymphocytes Relative 11 %    Lymphs Abs 1.1 0.7 - 4.0 K/uL    Monocytes Relative 8 %    Monocytes Absolute 0.7 0.1 - 1.0 K/uL    Eosinophils Relative 1 %    Eosinophils Absolute 0.1 0.0 - 0.5 K/uL    Basophils Relative 1 %    Basophils Absolute 0.1 0.0 - 0.1 K/uL    Immature Granulocytes 0 %    Abs Immature Granulocytes 0.03 0.00 - 0.07 K/uL      Comment: Performed at [facility], [address], [place], [place] [address]  Urinalysis, Routine w reflex microscopic Urine, Clean Catch     Status: Abnormal    Collection Time: 05/15/22  8:25 AM  Result Value Ref Range    Color, Urine YELLOW YELLOW    APPearance CLEAR CLEAR    Specific Gravity, Urine 1.015 1.005 - 1.030    pH 6.0 5.0 - 8.0    Glucose, UA NEGATIVE NEGATIVE mg/dL    Hgb urine  dipstick MODERATE (A) NEGATIVE    Bilirubin Urine NEGATIVE NEGATIVE    Ketones, ur NEGATIVE NEGATIVE mg/dL    Protein, ur NEGATIVE NEGATIVE mg/dL    Nitrite NEGATIVE NEGATIVE    Leukocytes,Ua NEGATIVE NEGATIVE    RBC / HPF 0-5 0 - 5 RBC/hpf    WBC, UA 0-5 0 - 5 WBC/hpf    Bacteria, UA NONE SEEN NONE SEEN      Comment: Performed at [facility], [address] [place], [place] [address]         Imaging Results (Last 48 hours)  US Abdomen Limited RUQ (LIVER/GB)   Result Date: 05/15/2022 CLINICAL DATA:  Right upper quadrant abdominal pain EXAM: ULTRASOUND ABDOMEN LIMITED RIGHT UPPER QUADRANT COMPARISON:  Abdominal CT from earlier today FINDINGS: Gallbladder: Gallbladder wall thickening with sludge but no stone or focal tenderness. Common bile duct: Diameter: 3 mm Liver: No focal lesion identified. Within normal limits in parenchymal echogenicity. Portal vein is patent on color Doppler imaging with normal direction of blood flow towards the liver. IMPRESSION: Gallbladder sludge and wall thickening but no focal tenderness or calculus typical of acute cholecystitis. Electronically Signed   By: Jorje Guild M.D.   On: 05/15/2022 11:40    CT ABDOMEN PELVIS W CONTRAST   Result Date: 05/15/2022 CLINICAL DATA:  Several day history of worsening mid sternal abdominal pain. EXAM: CT ABDOMEN AND PELVIS WITH CONTRAST TECHNIQUE: Multidetector CT imaging of the abdomen and pelvis was performed using the standard protocol following bolus administration of intravenous contrast. RADIATION DOSE REDUCTION: This exam was performed according to the departmental dose-optimization program which includes automated exposure control, adjustment of the mA and/or kV according to patient size and/or use of iterative reconstruction technique. CONTRAST:  151m OMNIPAQUE IOHEXOL 300 MG/ML  SOLN COMPARISON:  CT abdomen and pelvis dated 12/11/2017 FINDINGS: Lower chest: No focal consolidation or pulmonary nodule in the lung bases. No pleural effusion or pneumothorax demonstrated. Partially imaged heart size is normal. Hepatobiliary: 1.3 x 0.9 cm enhancing focus near the dome in segment 7 (2:12) not seen on prior examination. No intra or extrahepatic biliary ductal dilation. Mild gallbladder wall edema and pericholecystic stranding. Pancreas: No focal lesions or main ductal  dilation. Spleen: Normal in size without focal abnormality. Adrenals/Urinary Tract: No adrenal nodules. Nonobstructing stone in the left lower pole measures 10 mm. No hydronephrosis or suspicious renal masses. No focal bladder wall thickening. Stomach/Bowel: Small hiatal hernia. Normal appearance of the stomach. Duodenal diverticulum. No evidence of bowel wall thickening, distention, or inflammatory changes. Normal appendix. Colonic diverticulosis without acute diverticulitis. Vascular/Lymphatic: Aortic atherosclerosis. No enlarged abdominal or pelvic lymph nodes. Reproductive: Enlarged prostate gland. Other: No free fluid, fluid collection, or free air. Musculoskeletal: No acute or abnormal lytic or blastic osseous lesions. IMPRESSION: 1. Mild gallbladder wall edema and pericholecystic stranding, findings are suspicious for acute cholecystitis. Consider further evaluation with right upper quadrant ultrasound. 2. A 1.3 x 0.9 cm enhancing focus near the dome in segment 7 of the liver, not seen on prior examination. Findings are nonspecific and could reflect a flash filling hemangioma or vascular shunt. Consider evaluation with nonemergent liver protocol MRI with and without contrast. 3. Nonobstructing stone in the left lower pole measures 10 mm. 4. Colonic diverticulosis without acute diverticulitis. 5. Enlarged prostate gland. 6. Small hiatal hernia. 7. Aortic Atherosclerosis (ICD10-I70.0). Electronically Signed   By: LDarrin NipperM.D.   On: 05/15/2022 09:29         Assessment & Plan:  Allen Reading  Herring is a 61 y.o. male who presents with a 2-day history of epigastric abdominal pain.  Ultrasound with wall thickening, no stones, sludge, less convincing for acute cholecystitis.  Imaging and blood work evaluated by myself.   -Patient was discharged home from the ED with plan for outpatient HIDA scan -HIDA scan performed as an outpatient demonstrated concern for acute cholecystitis with cystic duct obstruction -I  called the patient to update him on this imaging study -I counseled the patient about the indication, risks and benefits of laparoscopic vs robotic assisted laparoscopic cholecystectomy.  He understands there is a very small chance for bleeding, infection, injury to normal structures (including common bile duct), conversion to open surgery, persistent symptoms, evolution of postcholecystectomy diarrhea, need for secondary interventions, anesthesia reaction, cardiopulmonary issues and other risks not specifically detailed here. I described the expected recovery, the plan for follow-up and the restrictions during the recovery phase.  All questions were answered. -Patient tentatively scheduled for surgery on 12/14 -Advised to present to hospital at [place] and to have nothing to eat or drink after midnight   All questions were answered to the satisfaction of the patient.   -- Graciella Freer, DO [facility] [address] [place], [place] [address] [phone] (office)

## 2022-05-18 NOTE — Telephone Encounter (Signed)
[facility]  Spoke with the patient to update him on the results of his HIDA scan.  I explained that this imaging study demonstrated concern for acute cholecystitis.  For this reason, we have scheduled him for a laparoscopic vs robotic cholecystectomy tomorrow afternoon, which will be performed by Dr. Constance Haw.  I counseled the patient about the indication, risks and benefits of laparoscopic cholecystectomy.  He understands there is a very small chance for bleeding, infection, injury to normal structures (including common bile duct), conversion to open surgery, persistent symptoms, evolution of postcholecystectomy diarrhea, need for secondary interventions, anesthesia reaction, cardiopulmonary issues and other risks not specifically detailed here. I described the expected recovery, the plan for follow-up and the restrictions during the recovery phase.  All questions were answered.  He will plan to arrive at the hospital at [place], and he is NPO after midnight.  All questions were answered to his expressed satisfaction.  Graciella Freer, DO [facility] [address] [place], [place] [address] [phone] (office)

## 2022-05-18 NOTE — Telephone Encounter (Signed)
Transition Care Management Follow-up Telephone Call Date of discharge and from where: [place] 05/15/2022 How have you been since you were released from the hospital? [facility]  Any questions or concerns? No  Items Reviewed: Did the pt receive and understand the discharge instructions provided? Yes  Medications obtained and verified? Yes  Other? Yes  Any new allergies since your discharge? No  Dietary orders reviewed? Yes Do you have support at home? Yes   Home Care and Equipment/Supplies: Were home health services ordered? no If so, what is the name of the agency? N/a  Has the agency set up a time to come to the patient's home? not applicable Were any new equipment or medical supplies ordered?  No What is the name of the medical supply agency? N/a Were you able to get the supplies/equipment? not applicable Do you have any questions related to the use of the equipment or supplies? No  Functional Questionnaire: (I = Independent and D = Dependent) ADLs: I  Bathing/Dressing- i  Meal Prep- i  Eating- i  Maintaining continence- i  Transferring/Ambulation- i  Managing Meds- i  Follow up appointments reviewed:  PCP Hospital f/u appt confirmed? No   Specialist Hospital f/u appt confirmed? Yes  Scheduled to see Gastro Dr.Goldstren  on 05/19/2022 @ 115pm . Are transportation arrangements needed? No  If their condition worsens, is the pt aware to call PCP or go to the Emergency Dept.? Yes Was the patient provided with contact information for the PCP's office or ED? Yes Was to pt encouraged to call back with questions or concerns? Yes

## 2022-05-19 ENCOUNTER — Ambulatory Visit: Payer: No Typology Code available for payment source | Admitting: Surgery

## 2022-05-19 ENCOUNTER — Ambulatory Visit (HOSPITAL_COMMUNITY)
Admission: RE | Admit: 2022-05-19 | Discharge: 2022-05-19 | Disposition: A | Discharge status: Home / Self Care | Payer: No Typology Code available for payment source | Attending: General Surgery | Admitting: General Surgery

## 2022-05-19 ENCOUNTER — Encounter (HOSPITAL_COMMUNITY)
Admission: RE | Disposition: A | Discharge status: Home / Self Care | Payer: Self-pay | Source: Home / Self Care | Attending: General Surgery

## 2022-05-19 ENCOUNTER — Encounter (HOSPITAL_COMMUNITY): Payer: Self-pay | Admitting: General Surgery

## 2022-05-19 ENCOUNTER — Other Ambulatory Visit: Payer: Self-pay

## 2022-05-19 ENCOUNTER — Ambulatory Visit (HOSPITAL_COMMUNITY): Payer: No Typology Code available for payment source | Admitting: Certified Registered"

## 2022-05-19 ENCOUNTER — Ambulatory Visit (HOSPITAL_BASED_OUTPATIENT_CLINIC_OR_DEPARTMENT_OTHER): Payer: No Typology Code available for payment source | Admitting: Certified Registered"

## 2022-05-19 DIAGNOSIS — K810 Acute cholecystitis: Secondary | ICD-10-CM | POA: Diagnosis present

## 2022-05-19 DIAGNOSIS — I10 Essential (primary) hypertension: Secondary | ICD-10-CM | POA: Insufficient documentation

## 2022-05-19 DIAGNOSIS — K219 Gastro-esophageal reflux disease without esophagitis: Secondary | ICD-10-CM | POA: Diagnosis not present

## 2022-05-19 DIAGNOSIS — Z87891 Personal history of nicotine dependence: Secondary | ICD-10-CM | POA: Insufficient documentation

## 2022-05-19 HISTORY — PX: CHOLECYSTECTOMY: SHX55

## 2022-05-19 SURGERY — LAPAROSCOPIC CHOLECYSTECTOMY
Anesthesia: General | Site: Abdomen

## 2022-05-19 MED ORDER — DEXAMETHASONE SODIUM PHOSPHATE 10 MG/ML IJ SOLN
INTRAMUSCULAR | Status: DC | PRN
  Administered 2022-05-19: 10 mg via INTRAVENOUS

## 2022-05-19 MED ORDER — LIDOCAINE 2% (20 MG/ML) 5 ML SYRINGE
INTRAMUSCULAR | Status: DC | PRN
  Administered 2022-05-19: 100 mg via INTRAVENOUS

## 2022-05-19 MED ORDER — ONDANSETRON HCL 4 MG/2ML IJ SOLN
INTRAMUSCULAR | Status: AC
  Filled 2022-05-19: qty 2

## 2022-05-19 MED ORDER — BUPIVACAINE LIPOSOME 1.3 % IJ SUSP
INTRAMUSCULAR | Status: DC | PRN
  Administered 2022-05-19: 20 mL

## 2022-05-19 MED ORDER — SUGAMMADEX SODIUM 500 MG/5ML IV SOLN
INTRAVENOUS | Status: AC
  Filled 2022-05-19: qty 5

## 2022-05-19 MED ORDER — HEMOSTATIC AGENTS (NO CHARGE) OPTIME
TOPICAL | Status: DC | PRN
  Administered 2022-05-19 (×2): 1

## 2022-05-19 MED ORDER — SUCCINYLCHOLINE CHLORIDE 200 MG/10ML IV SOSY
PREFILLED_SYRINGE | INTRAVENOUS | Status: AC
  Filled 2022-05-19: qty 10

## 2022-05-19 MED ORDER — FENTANYL CITRATE (PF) 100 MCG/2ML IJ SOLN
INTRAMUSCULAR | Status: DC | PRN
  Administered 2022-05-19 (×5): 50 ug via INTRAVENOUS

## 2022-05-19 MED ORDER — ONDANSETRON HCL 4 MG PO TABS: 4.0000 mg | ORAL_TABLET | Freq: Three times a day (TID) | ORAL | 1 refills | Status: DC | PRN

## 2022-05-19 MED ORDER — EPHEDRINE SULFATE-NACL 50-0.9 MG/10ML-% IV SOSY
PREFILLED_SYRINGE | INTRAVENOUS | Status: DC | PRN
  Administered 2022-05-19 (×2): 5 mg via INTRAVENOUS

## 2022-05-19 MED ORDER — PROPOFOL 10 MG/ML IV BOLUS
INTRAVENOUS | Status: DC | PRN
  Administered 2022-05-19: 30 mg via INTRAVENOUS
  Administered 2022-05-19: 150 mg via INTRAVENOUS
  Administered 2022-05-19: 20 mg via INTRAVENOUS

## 2022-05-19 MED ORDER — EPHEDRINE 5 MG/ML INJ
INTRAVENOUS | Status: AC
  Filled 2022-05-19: qty 5

## 2022-05-19 MED ORDER — PROPOFOL 10 MG/ML IV BOLUS
INTRAVENOUS | Status: AC
  Filled 2022-05-19: qty 20

## 2022-05-19 MED ORDER — DEXMEDETOMIDINE HCL IN NACL 80 MCG/20ML IV SOLN
INTRAVENOUS | Status: DC | PRN
  Administered 2022-05-19: 4 ug via BUCCAL

## 2022-05-19 MED ORDER — PHENYLEPHRINE 80 MCG/ML (10ML) SYRINGE FOR IV PUSH (FOR BLOOD PRESSURE SUPPORT)
PREFILLED_SYRINGE | INTRAVENOUS | Status: DC | PRN
  Administered 2022-05-19: 80 ug via INTRAVENOUS
  Administered 2022-05-19: 160 ug via INTRAVENOUS
  Administered 2022-05-19: 80 ug via INTRAVENOUS

## 2022-05-19 MED ORDER — GLYCOPYRROLATE 0.2 MG/ML IJ SOLN
INTRAMUSCULAR | Status: DC | PRN
  Administered 2022-05-19 (×2): .1 mg via INTRAVENOUS

## 2022-05-19 MED ORDER — ONDANSETRON HCL 4 MG/2ML IJ SOLN
INTRAMUSCULAR | Status: DC | PRN
  Administered 2022-05-19: 4 mg via INTRAVENOUS

## 2022-05-19 MED ORDER — CHLORHEXIDINE GLUCONATE CLOTH 2 % EX PADS: 6.0000 | MEDICATED_PAD | Freq: Once | CUTANEOUS | Status: DC

## 2022-05-19 MED ORDER — HYDROMORPHONE HCL 1 MG/ML IJ SOLN
0.2500 mg | INTRAMUSCULAR | Status: DC | PRN
  Administered 2022-05-19: 0.5 mg via INTRAVENOUS

## 2022-05-19 MED ORDER — AMOXICILLIN-POT CLAVULANATE 875-125 MG PO TABS: 1.0000 | ORAL_TABLET | Freq: Two times a day (BID) | ORAL | 0 refills | Status: DC

## 2022-05-19 MED ORDER — SODIUM CHLORIDE 0.9 % IV SOLN: 2.0000 g | INTRAVENOUS | Status: DC

## 2022-05-19 MED ORDER — HYDROCODONE-ACETAMINOPHEN 7.5-325 MG PO TABS: 1.0000 | ORAL_TABLET | Freq: Once | ORAL | Status: DC | PRN

## 2022-05-19 MED ORDER — MIDAZOLAM HCL 2 MG/2ML IJ SOLN
INTRAMUSCULAR | Status: AC
  Filled 2022-05-19: qty 2

## 2022-05-19 MED ORDER — ONDANSETRON HCL 4 MG/2ML IJ SOLN: 4.0000 mg | Freq: Once | INTRAMUSCULAR | Status: DC | PRN

## 2022-05-19 MED ORDER — SUGAMMADEX SODIUM 200 MG/2ML IV SOLN
INTRAVENOUS | Status: DC | PRN
  Administered 2022-05-19: 250 mg via INTRAVENOUS
  Administered 2022-05-19: 100 mg via INTRAVENOUS

## 2022-05-19 MED ORDER — KETOROLAC TROMETHAMINE 30 MG/ML IJ SOLN
INTRAMUSCULAR | Status: AC
  Filled 2022-05-19: qty 1

## 2022-05-19 MED ORDER — SODIUM CHLORIDE 0.9 % IV SOLN
INTRAVENOUS | Status: AC
  Filled 2022-05-19: qty 2

## 2022-05-19 MED ORDER — LACTATED RINGERS IV SOLN: INTRAVENOUS | Status: DC | PRN

## 2022-05-19 MED ORDER — FENTANYL CITRATE (PF) 250 MCG/5ML IJ SOLN
INTRAMUSCULAR | Status: AC
  Filled 2022-05-19: qty 5

## 2022-05-19 MED ORDER — BUPIVACAINE LIPOSOME 1.3 % IJ SUSP
INTRAMUSCULAR | Status: AC
  Filled 2022-05-19: qty 20

## 2022-05-19 MED ORDER — OXYCODONE HCL 5 MG PO TABS: 5.0000 mg | ORAL_TABLET | ORAL | 0 refills | Status: DC | PRN

## 2022-05-19 MED ORDER — DEXAMETHASONE SODIUM PHOSPHATE 10 MG/ML IJ SOLN
INTRAMUSCULAR | Status: AC
  Filled 2022-05-19: qty 2

## 2022-05-19 MED ORDER — KETOROLAC TROMETHAMINE 30 MG/ML IJ SOLN
30.0000 mg | Freq: Once | INTRAMUSCULAR | Status: AC
  Administered 2022-05-19: 30 mg via INTRAVENOUS

## 2022-05-19 MED ORDER — LIDOCAINE HCL (PF) 2 % IJ SOLN
INTRAMUSCULAR | Status: AC
  Filled 2022-05-19: qty 5

## 2022-05-19 MED ORDER — SODIUM CHLORIDE 0.9 % IR SOLN
Status: DC | PRN
  Administered 2022-05-19: 1000 mL

## 2022-05-19 MED ORDER — ROCURONIUM BROMIDE 10 MG/ML (PF) SYRINGE
PREFILLED_SYRINGE | INTRAVENOUS | Status: AC
  Filled 2022-05-19: qty 10

## 2022-05-19 MED ORDER — PHENYLEPHRINE 80 MCG/ML (10ML) SYRINGE FOR IV PUSH (FOR BLOOD PRESSURE SUPPORT)
PREFILLED_SYRINGE | INTRAVENOUS | Status: AC
  Filled 2022-05-19: qty 20

## 2022-05-19 MED ORDER — MIDAZOLAM HCL 5 MG/5ML IJ SOLN
INTRAMUSCULAR | Status: DC | PRN
  Administered 2022-05-19: 2 mg via INTRAVENOUS

## 2022-05-19 MED ORDER — HYDROMORPHONE HCL 1 MG/ML IJ SOLN
INTRAMUSCULAR | Status: AC
  Filled 2022-05-19: qty 0.5

## 2022-05-19 MED ORDER — ROCURONIUM BROMIDE 100 MG/10ML IV SOLN
INTRAVENOUS | Status: DC | PRN
  Administered 2022-05-19: 60 mg via INTRAVENOUS
  Administered 2022-05-19: 10 mg via INTRAVENOUS

## 2022-05-19 SURGICAL SUPPLY — 44 items
APPLIER CLIP ROT 10 11.4 M/L (STAPLE) ×1
BLADE SURG 15 STRL LF DISP TIS (BLADE) ×1 IMPLANT
BLADE SURG 15 STRL SS (BLADE) ×1
CHLORAPREP W/TINT 26 (MISCELLANEOUS) ×1 IMPLANT
CLIP APPLIE ROT 10 11.4 M/L (STAPLE) ×1 IMPLANT
CLOTH BEACON ORANGE TIMEOUT ST (SAFETY) ×1 IMPLANT
COVER LIGHT HANDLE STERIS (MISCELLANEOUS) ×2 IMPLANT
DERMABOND ADVANCED .7 DNX12 (GAUZE/BANDAGES/DRESSINGS) ×1 IMPLANT
DRSG TEGADERM 2-3/8X2-3/4 SM (GAUZE/BANDAGES/DRESSINGS) IMPLANT
DRSG TEGADERM 4X4.75 (GAUZE/BANDAGES/DRESSINGS) IMPLANT
ELECT REM PT RETURN 9FT ADLT (ELECTROSURGICAL) ×1
ELECTRODE REM PT RTRN 9FT ADLT (ELECTROSURGICAL) ×1 IMPLANT
EVACUATOR DRAINAGE 10X20 100CC (DRAIN) IMPLANT
EVACUATOR SILICONE 100CC (DRAIN) ×1
GAUZE SPONGE 4X4 12PLY STRL (GAUZE/BANDAGES/DRESSINGS) IMPLANT
GLOVE BIO SURGEON STRL SZ 6.5 (GLOVE) ×1 IMPLANT
GLOVE BIO SURGEON STRL SZ7 (GLOVE) IMPLANT
GLOVE BIOGEL PI IND STRL 6.5 (GLOVE) ×1 IMPLANT
GLOVE BIOGEL PI IND STRL 7.0 (GLOVE) ×2 IMPLANT
GOWN STRL REUS W/TWL LRG LVL3 (GOWN DISPOSABLE) ×3 IMPLANT
HEMOSTAT SNOW SURGICEL 2X4 (HEMOSTASIS) ×1 IMPLANT
INST SET LAPROSCOPIC AP (KITS) ×1 IMPLANT
KIT TURNOVER KIT A (KITS) ×1 IMPLANT
MANIFOLD NEPTUNE II (INSTRUMENTS) ×1 IMPLANT
NDL INSUFFLATION 14GA 120MM (NEEDLE) ×1 IMPLANT
NEEDLE INSUFFLATION 14GA 120MM (NEEDLE) ×1 IMPLANT
NS IRRIG 1000ML POUR BTL (IV SOLUTION) ×1 IMPLANT
PACK LAP CHOLE LZT030E (CUSTOM PROCEDURE TRAY) ×1 IMPLANT
PAD ARMBOARD 7.5X6 YLW CONV (MISCELLANEOUS) ×1 IMPLANT
SET BASIN LINEN APH (SET/KITS/TRAYS/PACK) ×1 IMPLANT
SET TUBE SMOKE EVAC HIGH FLOW (TUBING) ×1 IMPLANT
SLEEVE Z-THREAD 5X100MM (TROCAR) ×1 IMPLANT
SPONGE DRAIN TRACH 4X4 STRL 2S (GAUZE/BANDAGES/DRESSINGS) IMPLANT
SUT ETHILON 3 0 FSL (SUTURE) IMPLANT
SUT MNCRL AB 4-0 PS2 18 (SUTURE) ×2 IMPLANT
SUT VICRYL 0 UR6 27IN ABS (SUTURE) ×1 IMPLANT
SYS BAG RETRIEVAL 10MM (BASKET) ×1
SYSTEM BAG RETRIEVAL 10MM (BASKET) ×1 IMPLANT
TIP ENDOSCOPIC SURGICEL (TIP) IMPLANT
TROCAR Z-THRD FIOS HNDL 11X100 (TROCAR) ×1 IMPLANT
TROCAR Z-THREAD FIOS 5X100MM (TROCAR) ×1 IMPLANT
TROCAR Z-THREAD SLEEVE 11X100 (TROCAR) ×1 IMPLANT
TUBE CONNECTING 12X1/4 (SUCTIONS) ×1 IMPLANT
WARMER LAPAROSCOPE (MISCELLANEOUS) ×1 IMPLANT

## 2022-05-19 NOTE — Anesthesia Postprocedure Evaluation (Signed)
Anesthesia Post Note  Patient: Allen Herring  Procedure(s) Performed: LAPAROSCOPIC CHOLECYSTECTOMY (Abdomen)  Patient location during evaluation: PACU Anesthesia Type: General Level of consciousness: awake and alert Pain management: pain level controlled Vital Signs Assessment: post-procedure vital signs reviewed and stable Respiratory status: spontaneous breathing, nonlabored ventilation, respiratory function stable and patient connected to nasal cannula oxygen Cardiovascular status: blood pressure returned to baseline and stable Postop Assessment: no apparent nausea or vomiting Anesthetic complications: no   There were no known notable events for this encounter.   Last Vitals:  Vitals:   05/19/22 1500 05/19/22 1515  BP: (!) 157/96 126/78  Pulse: 83 85  Resp: (!) 25 19  Temp:    SpO2: 98% 92%    Last Pain:  Vitals:   05/19/22 1455  TempSrc:   PainSc: 6                  Trixie Rude

## 2022-05-19 NOTE — Anesthesia Preprocedure Evaluation (Signed)
Anesthesia Evaluation  Patient identified by MRN, date of birth, ID band Patient awake    Reviewed: Allergy & Precautions, NPO status , Patient's Chart, lab work & pertinent test results  Airway Mallampati: IV  TM Distance: >3 FB Neck ROM: Full   Comment: beard Dental no notable dental hx.    Pulmonary former smoker   Pulmonary exam normal        Cardiovascular Exercise Tolerance: Good hypertension, Normal cardiovascular exam     Neuro/Psych  negative psych ROS   GI/Hepatic Neg liver ROS,GERD  ,,  Endo/Other  negative endocrine ROS    Renal/GU negative Renal ROS     Musculoskeletal   Abdominal  (+) + obese  Peds  Hematology negative hematology ROS (+) polycythemia   Anesthesia Other Findings   Reproductive/Obstetrics                             Anesthesia Physical Anesthesia Plan  ASA: 2  Anesthesia Plan: General   Post-op Pain Management:    Induction: Intravenous  PONV Risk Score and Plan: 2  Airway Management Planned: Oral ETT  Additional Equipment:   Intra-op Plan:   Post-operative Plan: Extubation in OR  Informed Consent: I have reviewed the patients History and Physical, chart, labs and discussed the procedure including the risks, benefits and alternatives for the proposed anesthesia with the patient or authorized representative who has indicated his/her understanding and acceptance.       Plan Discussed with: CRNA  Anesthesia Plan Comments:        Anesthesia Quick Evaluation

## 2022-05-19 NOTE — Interval H&P Note (Signed)
History and Physical Interval Note:  05/19/2022 12:32 PM  Allen Herring  has presented today for surgery, with the diagnosis of ACUTE CHOLECYSTITIS.  The various methods of treatment have been discussed with the patient and family. After consideration of risks, benefits and other options for treatment, the patient has consented to  Procedure(s): LAPAROSCOPIC CHOLECYSTECTOMY (N/A) as a surgical intervention.  The patient's history has been reviewed, patient examined, no change in status, stable for surgery.  I have reviewed the patient's chart and labs.  Questions were answered to the patient's satisfaction.    PLAN: I counseled the patient about the indication, risks and benefits of robotic assisted laparoscopic cholecystectomy.  He understands there is a very small chance for bleeding, infection, injury to normal structures (including common bile duct), conversion to open surgery, persistent symptoms, evolution of postcholecystectomy diarrhea, need for secondary interventions, anesthesia reaction, cardiopulmonary issues and other risks not specifically detailed here. I described the expected recovery, the plan for follow-up and the restrictions during the recovery phase.  All questions were answered. Discussed laparoscopic surgery with patient. Robot not available today.   RUQ tenderness  [name]

## 2022-05-19 NOTE — Transfer of Care (Signed)
Immediate Anesthesia Transfer of Care Note  Patient: Allen Herring  Procedure(s) Performed: LAPAROSCOPIC CHOLECYSTECTOMY (Abdomen)  Patient Location: PACU  Anesthesia Type:General  Level of Consciousness: drowsy and patient cooperative  Airway & Oxygen Therapy: Patient Spontanous Breathing and Patient connected to face mask oxygen  Post-op Assessment: Report given to RN and Post -op Vital signs reviewed and stable  Post vital signs: Reviewed and stable  Last Vitals:  Vitals Value Taken Time  BP 157/95 05/19/22 1445  Temp    Pulse 77 05/19/22 1446  Resp 28 05/19/22 1446  SpO2 96 % 05/19/22 1446  Vitals shown include unvalidated device data.  Last Pain:  Vitals:   05/19/22 1144  TempSrc: Oral  PainSc: 3       Patients Stated Pain Goal: 4 (42/68/34 1963)  Complications: No notable events documented.

## 2022-05-19 NOTE — Progress Notes (Signed)
[facility]  Updated partner, Sharyn Lull. Updated on the purulence in the gallbladder, need for antibiotics, cystic duct being friable and having to be reclipped, and need to leave drain. Discussed drain color should be bloody to bloody brown/ reddish brown to yellow straw. If the drain turns green they are to call the office. I will see them next Thursday.  Curlene Labrum, MD [facility] [address] [place], [place] [address] [phone] (office)

## 2022-05-19 NOTE — Discharge Instructions (Addendum)
JP drain care: Your drain color will be bloody reddish brown to a straw yellow color, but if it turns green let someone know that it has turned green. If this occurs please call the office immediately [phone].  The RN will instruct you and your family before you leave. Please keep the drain clean and dry. Replace the gauze/ tape around the drain if it gets dirty or wet/ saturated. Please do not mess with or cut the stitch that is keeping the drain in place. Secure the drain to your clothes so that it does not get dislodged.  You may want to wear a binder around your abdomen (girdle) at night for sleeping if you are worried about it getting pulled out.  Please record the output from the drain daily including the color and the amount in milliliters.  They will make sure you have a form to record this as well as a cup to record the amount.  Please keep the drain covered with plastic and tape when you shower so that it does not get wet or take a birdbath and do not get the entry site of the drain wet.   Discharge Laparoscopic Surgery Instructions:  Common Complaints: Right shoulder pain is common after laparoscopic surgery. This is secondary to the gas used in the surgery being trapped under the diaphragm.  Walk to help your body absorb the gas. This will improve in a few days. Pain at the port sites are common, especially the larger port sites. This will improve with time.  Some nausea is common and poor appetite. The main goal is to stay hydrated the first few days after surgery.   Diet/ Activity: Diet as tolerated. You may not have an appetite, but it is important to stay hydrated. Drink 64 ounces of water a day. Your appetite will return with time.  Shower per your regular routine daily.  Cover the JP drain with plastic and tape before showering.  Rest and listen to your body, but do not remain in bed all day.  Walk everyday for at least 15-20 minutes. Deep cough and move around every  1-2 hours in the first few days after surgery.  Do not lift > 10 lbs, perform excessive bending, pushing, pulling, squatting for 1-2 weeks after surgery.  Do not pick at the staples. You can remove your gauze and bandages on 05/21/2022.  Do not place lotions or balms on your incision unless instructed to specifically by Dr. Constance Haw.   Pain Expectations and Narcotics: -After surgery you will have pain associated with your incisions and this is normal. The pain is muscular and nerve pain, and will get better with time. -You are encouraged and expected to take non narcotic medications like tylenol and ibuprofen (when able) to treat pain as multiple modalities can aid with pain treatment. -Narcotics are only used when pain is severe or there is breakthrough pain. -You are not expected to have a pain score of 0 after surgery, as we cannot prevent pain. A pain score of 3-4 that allows you to be functional, move, walk, and tolerate some activity is the goal. The pain will continue to improve over the days after surgery and is dependent on your surgery. -Due to [place] law, we are only able to give a certain amount of pain medication to treat post operative pain, and we only give additional narcotics on a patient by patient basis.  -For most laparoscopic surgery, studies have shown that the majority of patients only  need 10-15 narcotic pills, and for open surgeries most patients only need 15-20.   -Having appropriate expectations of pain and knowledge of pain management with non narcotics is important as we do not want anyone to become addicted to narcotic pain medication.  -Using ice packs in the first 48 hours and heating pads after 48 hours, wearing an abdominal binder (when recommended), and using over the counter medications are all ways to help with pain management.   -Simple acts like meditation and mindfulness practices after surgery can also help with pain control and research has proven the benefit of  these practices.  Medication: Take tylenol and ibuprofen as needed for pain control, alternating every 4-6 hours.  Example:  Tylenol '1000mg'$  @ 6am, 12noon, 6pm, 74mdnight (Do not exceed '4000mg'$  of tylenol a day). Ibuprofen '800mg'$  @ 9am, 3pm, 9pm, 3am (Do not exceed '3600mg'$  of ibuprofen a day).  Take Roxicodone for breakthrough pain every 4 hours.  Take Colace for constipation related to narcotic pain medication. If you do not have a bowel movement in 2 days, take Miralax over the counter.  Drink plenty of water to also prevent constipation.   Contact Information: If you have questions or concerns, please call our office, [phone] Monday- Thursday 8AM-5PM and Friday 8AM-12Noon.  If it is after hours or on the weekend, please call [facility], [phone] and ask to speak to the surgeon on call [facility]

## 2022-05-19 NOTE — Anesthesia Procedure Notes (Signed)
Procedure Name: Intubation Date/Time: 05/19/2022 1:05 PM  Performed by: Gwyndolyn Saxon, CRNAPre-anesthesia Checklist: Patient identified, Emergency Drugs available, Suction available and Patient being monitored Patient Re-evaluated:Patient Re-evaluated prior to induction Oxygen Delivery Method: Circle system utilized Preoxygenation: Pre-oxygenation with 100% oxygen Induction Type: IV induction Ventilation: Mask ventilation without difficulty Laryngoscope Size: Mac and 4 Grade View: Grade I Tube type: Oral Tube size: 8.0 mm Number of attempts: 1 Airway Equipment and Method: Patient positioned with wedge pillow and Stylet Placement Confirmation: ETT inserted through vocal cords under direct vision, positive ETCO2 and breath sounds checked- equal and bilateral Secured at: 24 cm Tube secured with: Tape Dental Injury: Teeth and Oropharynx as per pre-operative assessment  Comments: Pt with poor dentition; loose front left upper tooth prior to induction. No change in dentition, lips, or gums after intubation

## 2022-05-19 NOTE — Op Note (Signed)
Operative Note   Preoperative Diagnosis: Acute cholecystitis    Postoperative Diagnosis: Same   Procedure(s) Performed: Laparoscopic cholecystectomy and JP drain placement    Surgeon: Lanell Matar. Constance Haw, MD   Assistants: No qualified resident was available   Anesthesia: General endotracheal   Anesthesiologist: Trixie Rude, MD    Specimens: Gallbladder    Estimated Blood Loss: Minimal    Blood Replacement: None    Complications: None   Class: Dirty Infected    Operative Findings: Gallbladder with purulence, very friable cystic duct, torn with clips initially, replaced clips on cystic duct, no signs of leak, JP left in place    Procedure: The patient was taken to the operating room and placed supine. General endotracheal anesthesia was induced. Intravenous antibiotics were administered per protocol. An orogastric tube positioned to decompress the stomach. The abdomen was prepared and draped in the usual sterile fashion.    A supraumbilical incision was made and a Veress technique was utilized to achieve pneumoperitoneum to 15 mmHg with carbon dioxide. A 11 mm optiview port was placed through the supraumbilical region, and a 10 mm 0-degree operative laparoscope was introduced. The area underlying the trocar and Veress needle were inspected and without evidence of injury.  Remaining trocars were placed under direct vision. Two 5 mm ports were placed in the right abdomen, between the anterior axillary and midclavicular line.  A final 11 mm port was placed through the mid-epigastrium, near the falciform ligament.  The gallbladder was distended and injected. I opted to decompress it and purulence was suctioned out of the gallbladder.    The gallbladder fundus was elevated cephalad and the infundibulum was retracted to the patient's right. The gallbladder/cystic duct junction was skeletonized. The cystic artery noted in the triangle of Calot and was also skeletonized.  We then continued  liberal medial and lateral dissection until the critical view of safety was achieved. The cystic duct had significant fibrosis and looked friable but was small enough for clips.    The cystic duct was doubly clipped but the clips essentially tore the duct causing it to retract down. The cystic artery were doubly clipped and divided. A small posterior branch in the mesentery was clipped and divided. The gallbladder was then dissected from the liver bed with electrocautery.   I then turned back to getting to cystic duct stump pulled back into view. We switched to a 30 degree camera. I looked the duct and was able to elevate it enough to get 2 clips on it that appeared to be across the whole duct. We were at least 1 cm from the common duct.  Given the tissue friability of the cystic duct, I opted to leave a JP drain pending these clips falling off from the duct necrosing.  A JP drain was pulled through the lateral most port site and placed in the hepatic fossa. The specimen was placed in an Endopouch and was retrieved through the epigastric site.   Final inspection revealed acceptable hemostasis. Surgical powder and SNOW were placed in the gallbladder bed.  Trocars were removed and pneumoperitoneum was released.  0 Vicryl fascial sutures were used to close the epigastric and umbilical port sites. The port site with the JP drain was secured with a 3-0 Nylon. The skin incisions were closed with staples given the purulence. Gauze and tape were place. The patient was awakened from anesthesia and extubated without complication.    Curlene Labrum, MD [facility]  [place], [place] [address] [phone] (office)

## 2022-05-20 ENCOUNTER — Telehealth: Payer: Self-pay | Admitting: *Deleted

## 2022-05-20 LAB — SURGICAL PATHOLOGY

## 2022-05-20 MED ORDER — AMOXICILLIN-POT CLAVULANATE 875-125 MG PO TABS: 1.0000 | ORAL_TABLET | Freq: Two times a day (BID) | ORAL | 0 refills | Status: AC

## 2022-05-20 NOTE — Telephone Encounter (Signed)
Surgical Date: 05/19/2022 Procedure: Lap Chole  Allergies: NKDA Pharm[facility]: [facility], [place]  Received call from patient and patient significant other, Michelle [phone]~ telephone.   Reports that ABTx prescription was obtained and noted to be #5 tabs. Discussed with Dr. Constance Haw and new prescription sent to pharmacy for 5 day supply. Patient aware.   Reports that patient is doing well today. Surgical sites are pink with slight crust. Denies fever, but does endorse slight chills last night.   Drain site is pink with minimal clear yellow drainage from site. Drain is patent with no clots noted. Drainage noted to be thin, but cherry red in color. States that drain has been emptied x3 with 30m removed at each change.   Advised to continue to monitor and record output. Given S/ Sx to watch out for.

## 2022-05-23 ENCOUNTER — Other Ambulatory Visit (HOSPITAL_COMMUNITY): Payer: No Typology Code available for payment source

## 2022-05-24 ENCOUNTER — Encounter (HOSPITAL_COMMUNITY): Payer: Self-pay | Admitting: General Surgery

## 2022-05-26 ENCOUNTER — Encounter: Payer: Self-pay | Admitting: General Surgery

## 2022-05-26 ENCOUNTER — Ambulatory Visit (INDEPENDENT_AMBULATORY_CARE_PROVIDER_SITE_OTHER): Payer: No Typology Code available for payment source | Admitting: General Surgery

## 2022-05-26 ENCOUNTER — Encounter: Payer: Self-pay | Admitting: *Deleted

## 2022-05-26 VITALS — BP 159/104 | HR 68 | Temp 97.8°F | Resp 16 | Ht 74.0 in | Wt 232.0 lb

## 2022-05-26 DIAGNOSIS — Z09 Encounter for follow-up examination after completed treatment for conditions other than malignant neoplasm: Secondary | ICD-10-CM

## 2022-05-26 DIAGNOSIS — K810 Acute cholecystitis: Secondary | ICD-10-CM

## 2022-05-26 NOTE — Patient Instructions (Signed)
Will get HIDA scan and hopefully get your drain out as soon as we know that there is no leak.

## 2022-05-26 NOTE — Progress Notes (Signed)
[facility]  Doing well. Drain is not putting out much but is more dark brown/ red. Nothing has turned great.   He is doing well without complaints. Drain putting out 10-15cc.  BP (!) 159/104   Pulse 68   Temp 97.8 F (36.6 C) (Oral)   Resp 16   Ht '6\' 2"'$  (1.88 m)   Wt 232 lb (105.2 kg)   SpO2 94%   BMI 29.79 kg/m  JP with dark brown fluid, ? Minimal suds   Patient s/p laparoscopic cholecystectomy, difficult to place cystic duct clips and JP drain left to ensure no leak. Drainage could just be from hemostatic agents, but I want to ensure he has not bile leak before removing the drain.  HIDA now to rule out leak Will call with results and try to get the drain out as soon as we know it is safe.   Curlene Labrum, MD [facility] [address] [place], [place] [address] [phone] (office)

## 2022-05-27 ENCOUNTER — Encounter (HOSPITAL_COMMUNITY): Payer: Self-pay

## 2022-05-27 ENCOUNTER — Ambulatory Visit (HOSPITAL_COMMUNITY)
Admission: RE | Admit: 2022-05-27 | Discharge: 2022-05-27 | Disposition: A | Discharge status: Home / Self Care | Payer: No Typology Code available for payment source | Source: Ambulatory Visit | Attending: General Surgery | Admitting: General Surgery

## 2022-05-27 ENCOUNTER — Telehealth (INDEPENDENT_AMBULATORY_CARE_PROVIDER_SITE_OTHER): Payer: No Typology Code available for payment source | Admitting: General Surgery

## 2022-05-27 DIAGNOSIS — Z09 Encounter for follow-up examination after completed treatment for conditions other than malignant neoplasm: Secondary | ICD-10-CM

## 2022-05-27 DIAGNOSIS — K810 Acute cholecystitis: Secondary | ICD-10-CM | POA: Insufficient documentation

## 2022-05-27 MED ORDER — TECHNETIUM TC 99M MEBROFENIN IV KIT
5.0000 | PACK | Freq: Once | INTRAVENOUS | Status: AC | PRN
  Administered 2022-05-27: 5.1 via INTRAVENOUS

## 2022-05-27 NOTE — Telephone Encounter (Signed)
Noted  

## 2022-05-27 NOTE — Telephone Encounter (Signed)
[facility]  Talked to patient in person. HIDA negative for leak. JP removed. Dressing placed. Remove in 48 hours.  Curlene Labrum, MD [facility] [address] [place], [place] [address] [phone] (office)

## 2022-06-21 ENCOUNTER — Encounter: Payer: Self-pay | Admitting: *Deleted

## 2022-07-22 ENCOUNTER — Telehealth: Payer: Self-pay | Admitting: *Deleted

## 2022-07-22 NOTE — Telephone Encounter (Signed)
Received fax from [facility], [employer]~ [phone]~ telephone/ [phone]~ direct telephone/ [phone]~ fax.  Electronically signed HIPPA Compliant Authorization for Release of Medical Information enclosed with date of 07/08/2022.  Medical records requested: chart notes, operative note, AVS from hospitalization   Records from [facility] faxed to [facility].

## 2022-07-22 NOTE — Telephone Encounter (Signed)
Received fax confirmation

## 2023-02-28 ENCOUNTER — Telehealth: Payer: Self-pay

## 2023-02-28 NOTE — Telephone Encounter (Signed)
Called patient to schedule new patient appointment, no answer, unable to leave a message

## 2023-03-01 ENCOUNTER — Encounter: Payer: Self-pay | Admitting: Family Medicine

## 2023-03-01 NOTE — Progress Notes (Deleted)
New Patient Office Visit  Subjective    Patient ID: Allen Herring, male    DOB: July 10, 1960  Age: 62 y.o. MRN: [id number]  CC: No chief complaint on file.   HPI Allen Herring presents to establish care with new provider.   Patients previous primary care provider was [facility] in [place], [place].  Last seen:  Prior to [facility], patient was under the care of Bennie Pierini, FNP with [facility]. Last office visit: 01/2021.   Specialist:   Hypertension: Chronic. Patient is taking Lisinopril 20mg  daily.    Outpatient Encounter Medications as of 03/03/2023  Medication Sig   famotidine (PEPCID) 20 MG tablet Take 1 tablet (20 mg total) by mouth 2 (two) times daily.   ibuprofen (ADVIL) 200 MG tablet Take 200-600 mg by mouth every 8 (eight) hours as needed (pain.).   lisinopril (ZESTRIL) 20 MG tablet Take 1 tablet (20 mg total) by mouth daily. (NEEDS TO BE SEEN BEFORE NEXT REFILL)   ondansetron (ZOFRAN) 4 MG tablet Take 1 tablet (4 mg total) by mouth every 8 (eight) hours as needed.   oxyCODONE (ROXICODONE) 5 MG immediate release tablet Take 1 tablet (5 mg total) by mouth every 4 (four) hours as needed for severe pain or breakthrough pain.   pantoprazole (PROTONIX) 40 MG tablet Take 1 tablet (40 mg total) by mouth daily.   No facility-administered encounter medications on file as of 03/03/2023.    Past Medical History:  Diagnosis Date   GERD (gastroesophageal reflux disease)    Hypertension    Polycythemia 10/17/2011    Past Surgical History:  Procedure Laterality Date   CERVICAL SPINE SURGERY  05/2016   CHOLECYSTECTOMY N/A 05/19/2022   Procedure: LAPAROSCOPIC CHOLECYSTECTOMY;  Surgeon: Lucretia Roers, MD;  Location: [facility];  Service: General;  Laterality: N/A;   Dental extractions      Family History  Problem Relation Age of Onset   Cancer Maternal Grandfather        head and neck cancer   Fibromyalgia Sister     Hypertension Brother     Social History   Socioeconomic History   Marital status: Married    Spouse name: Not on file   Number of children: 1   Years of education: Not on file   Highest education level: Not on file  Occupational History    Comment: Journalist, newspaper  Tobacco Use   Smoking status: Former    Types: Cigars   Smokeless tobacco: Never  Substance and Sexual Activity   Alcohol use: No   Drug use: No   Sexual activity: Yes    Birth control/protection: None  Other Topics Concern   Not on file  Social History Narrative   Not on file   Social Determinants of Health   Financial Resource Strain: Not on file  Food Insecurity: Not on file  Transportation Needs: Not on file  Physical Activity: Not on file  Stress: Not on file  Social Connections: Not on file  Intimate Partner Violence: Not on file    ROS See HPI above    Objective    There were no vitals taken for this visit.  Physical Exam    Assessment & Plan:  There are no diagnoses linked to this encounter. 1.Review of health maintenance:  -Covid vaccine/booster: -Tdap vaccine:  -Cologuard or Colonoscopy: -Hep C/HIV screening:  -Influenza vaccine:  -Zoster vaccine:  No follow-ups on file.   Zandra Abts, NP

## 2023-03-03 ENCOUNTER — Ambulatory Visit: Payer: No Typology Code available for payment source | Admitting: Family Medicine

## 2023-03-03 ENCOUNTER — Encounter: Payer: Self-pay | Admitting: Family Medicine

## 2023-03-03 ENCOUNTER — Telehealth: Payer: Self-pay

## 2023-03-03 ENCOUNTER — Ambulatory Visit (INDEPENDENT_AMBULATORY_CARE_PROVIDER_SITE_OTHER)
Admission: RE | Admit: 2023-03-03 | Discharge: 2023-03-03 | Disposition: A | Discharge status: Home / Self Care | Payer: No Typology Code available for payment source | Source: Ambulatory Visit | Attending: Family Medicine

## 2023-03-03 ENCOUNTER — Telehealth: Payer: Self-pay | Admitting: Family Medicine

## 2023-03-03 VITALS — BP 124/84 | HR 73 | Temp 98.2°F | Ht 74.0 in | Wt 237.4 lb

## 2023-03-03 DIAGNOSIS — M25552 Pain in left hip: Secondary | ICD-10-CM

## 2023-03-03 DIAGNOSIS — Z7689 Persons encountering health services in other specified circumstances: Secondary | ICD-10-CM

## 2023-03-03 DIAGNOSIS — I10 Essential (primary) hypertension: Secondary | ICD-10-CM

## 2023-03-03 LAB — COMPREHENSIVE METABOLIC PANEL
ALT: 32 U/L (ref 0–53)
AST: 26 U/L (ref 0–37)
Albumin: 4.5 g/dL (ref 3.5–5.2)
Alkaline Phosphatase: 59 U/L (ref 39–117)
BUN: 17 mg/dL (ref 6–23)
CO2: 27 meq/L (ref 19–32)
Calcium: 9.6 mg/dL (ref 8.4–10.5)
Chloride: 105 meq/L (ref 96–112)
Creatinine, Ser: 0.9 mg/dL (ref 0.40–1.50)
GFR: 91.08 mL/min (ref >60.00)
Glucose, Bld: 94 mg/dL (ref 70–99)
Potassium: 3.8 meq/L (ref 3.5–5.1)
Sodium: 140 meq/L (ref 135–145)
Total Bilirubin: 0.5 mg/dL (ref 0.2–1.2)
Total Protein: 7.4 g/dL (ref 6.0–8.3)

## 2023-03-03 MED ORDER — MELOXICAM 7.5 MG PO TABS: 7.5000 mg | ORAL_TABLET | Freq: Every day | ORAL | 0 refills | Status: DC

## 2023-03-03 NOTE — Telephone Encounter (Signed)
-----   Message from Zandra Abts sent at 03/03/2023  4:24 PM EDT ----- Your kidney function is stable.

## 2023-03-03 NOTE — Telephone Encounter (Signed)
Pt stated he needs a return to work note and requested that it specify his limitations such as climbing.

## 2023-03-03 NOTE — Patient Instructions (Addendum)
-  It was a pleasure to meet you today and look forward to taking care of you. -Continue with Lisinopril for blood pressure management.  -Ordered labs. Office will call with lab results and you will see results on MyChart. -START Meloxicam 7.5mg  daily for left hip pain. Recommend to not take additional NSAIDS, such as Advil, Ibuprofen, Aleve, and Naproxen. Recommend to eat something when taking medication. -Ordered left hip x-ray. Please go to the following location and office will call with results.  [facility] or xray: Walk in 8:30-4:30 during weekdays, no appointment needed [address]  [place], [place] [address]  -Follow up in 1 month chronic management.

## 2023-03-03 NOTE — Telephone Encounter (Signed)
Pt requesting return to work note with limitations included such as climbing.

## 2023-03-03 NOTE — Assessment & Plan Note (Signed)
Stable. Continue taking Lisinopril 20mg  daily. Ordered CMP to assess kidney function. Patient does not need a refill at this time.

## 2023-03-03 NOTE — Progress Notes (Addendum)
New Patient Office Visit  Subjective    Patient ID: Allen Herring, male    DOB: 08/13/1960  Age: 62 y.o. MRN: [id number]  CC:  Chief Complaint  Patient presents with   Establish Care    Pt is here today to Est.Care Pt reports he is not FASTING Pt reports his left hip has pain , he can not cross leg  Pt PCP was [facility] in past. Pt reports no refills today.    HPI Allen Herring presents to establish care with new provider.   Patients previous primary care provider was [facility] in [place], [place].  Last seen: 3-4 months ago, possibly longer  Prior to [facility], patient was under the care of Bennie Pierini, FNP with [facility]. Last office visit: 01/2021.   Specialist: None   Hypertension: Chronic. Patient is taking Lisinopril 20mg  daily. He reports he monitors his blood pressure once in a while. Denies CP, HA, SHOB, dizziness, lightheadedness, or lower extremity edema.   Patient is complaining of left hip pain. He reports his left hip started hurting more frequently in the last month. Initially it started only when ambulating at lot at work. Now, it is painful daily. He reports sometimes it is sore when he first wakes up, but as the day goes by, the pain gradually becomes worse. Described as a sharp pain. No radiating pain. Denies any numbness and tingling down left lower extremity. He reports it has become hard to cross his left leg over his right leg. Ambulating makes it worse. He reports stretching has seemed to help only in the morning. Also, been taking Advil as needed for pain, will help relief the pain. Denies any previous or recent injury.   Outpatient Encounter Medications as of 03/03/2023  Medication Sig   lisinopril (ZESTRIL) 20 MG tablet Take 1 tablet (20 mg total) by mouth daily. (NEEDS TO BE SEEN BEFORE NEXT REFILL)   meloxicam (MOBIC) 7.5 MG tablet Take 1 tablet (7.5 mg total) by mouth daily.    [DISCONTINUED] famotidine (PEPCID) 20 MG tablet Take 1 tablet (20 mg total) by mouth 2 (two) times daily.   [DISCONTINUED] ibuprofen (ADVIL) 200 MG tablet Take 200-600 mg by mouth every 8 (eight) hours as needed (pain.).   [DISCONTINUED] ondansetron (ZOFRAN) 4 MG tablet Take 1 tablet (4 mg total) by mouth every 8 (eight) hours as needed. (Patient not taking: Reported on 03/03/2023)   [DISCONTINUED] oxyCODONE (ROXICODONE) 5 MG immediate release tablet Take 1 tablet (5 mg total) by mouth every 4 (four) hours as needed for severe pain or breakthrough pain. (Patient not taking: Reported on 03/03/2023)   [DISCONTINUED] pantoprazole (PROTONIX) 40 MG tablet Take 1 tablet (40 mg total) by mouth daily. (Patient not taking: Reported on 03/03/2023)   No facility-administered encounter medications on file as of 03/03/2023.    Past Medical History:  Diagnosis Date   Diverticulitis    GERD (gastroesophageal reflux disease)    History of cholecystectomy    Hypertension    Polycythemia 10/17/2011    Past Surgical History:  Procedure Laterality Date   CERVICAL SPINE SURGERY  05/2016   CHOLECYSTECTOMY N/A 05/19/2022   Procedure: LAPAROSCOPIC CHOLECYSTECTOMY;  Surgeon: Lucretia Roers, MD;  Location: [facility];  Service: General;  Laterality: N/A;   Dental extractions      Family History  Problem Relation Age of Onset   Alzheimer's disease Mother    Fibromyalgia Sister    Hypertension Brother  Cancer Maternal Grandfather        head and neck cancer   Cancer Paternal Grandmother     Social History   Socioeconomic History   Marital status: Married    Spouse name: Not on file   Number of children: 1   Years of education: Not on file   Highest education level: Not on file  Occupational History    Comment: Journalist, newspaper   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  Tobacco Use   Smoking status: Former    Types: Cigars   Smokeless tobacco: Never  Vaping Use   Vaping status: Never Used   Substance and Sexual Activity   Alcohol use: No    Comment: 2x year   Drug use: No   Sexual activity: Yes    Birth control/protection: None  Other Topics Concern   Not on file  Social History Narrative   Not on file   Social Determinants of Health   Financial Resource Strain: Low Risk  (03/03/2023)   Overall Financial Resource Strain (CARDIA)    Difficulty of Paying Living Expenses: Not hard at all  Food Insecurity: No Food Insecurity (03/03/2023)   Hunger Vital Sign    Worried About Running Out of Food in the Last Year: Never true    Ran Out of Food in the Last Year: Never true  Transportation Needs: No Transportation Needs (03/03/2023)   PRAPARE - Administrator, Civil Service (Medical): No    Lack of Transportation (Non-Medical): No  Physical Activity: Insufficiently Active (03/03/2023)   Exercise Vital Sign    Days of Exercise per Week: 3 days    Minutes of Exercise per Session: 10 min  Stress: No Stress Concern Present (03/03/2023)   [employer] of Occupational Health - Occupational Stress Questionnaire    Feeling of Stress : Not at all  Social Connections: Moderately Integrated (03/03/2023)   Social Connection and Isolation Panel [NHANES]    Frequency of Communication with Friends and Family: Three times a week    Frequency of Social Gatherings with Friends and Family: Three times a week    Attends Religious Services: 1 to 4 times per year    Active Member of Clubs or Organizations: No    Attends Banker Meetings: Never    Marital Status: Married  Catering manager Violence: Not At Risk (03/03/2023)   Humiliation, Afraid, Rape, and Kick questionnaire    Fear of Current or Ex-Partner: No    Emotionally Abused: No    Physically Abused: No    Sexually Abused: No    ROS See HPI above    Objective   BP 124/84   Pulse 73   Temp 98.2 F (36.8 C)   Ht 6\' 2"  (1.88 m)   Wt 237 lb 6 oz (107.7 kg)   SpO2 98%   BMI 30.48 kg/m   Physical  Exam Vitals reviewed.  Constitutional:      General: He is not in acute distress.    Appearance: Normal appearance. He is not ill-appearing, toxic-appearing or diaphoretic.  Eyes:     General:        Right eye: No discharge.        Left eye: No discharge.     Conjunctiva/sclera: Conjunctivae normal.  Cardiovascular:     Rate and Rhythm: Normal rate and regular rhythm.     Heart sounds: Normal heart sounds. No murmur heard.    No friction rub. No gallop.  Pulmonary:  Effort: Pulmonary effort is normal. No respiratory distress.     Breath sounds: Normal breath sounds.  Musculoskeletal:     Lumbar back: Negative left straight leg raise test.     Left hip: Tenderness (Lateral and internal) present. No deformity. Decreased range of motion (Internal and external rotation.).  Skin:    General: Skin is warm and dry.  Neurological:     General: No focal deficit present.     Mental Status: He is alert and oriented to person, place, and time. Mental status is at baseline.  Psychiatric:        Mood and Affect: Mood normal.        Behavior: Behavior normal.        Thought Content: Thought content normal.        Judgment: Judgment normal.      Assessment & Plan:  Encounter to establish care  Primary hypertension Assessment & Plan: Stable. Continue taking Lisinopril 20mg  daily. Ordered CMP to assess kidney function. Patient does not need a refill at this time.   Orders: -     Comprehensive metabolic panel  Left hip pain -     DG HIP UNILAT W OR W/O PELVIS 2-3 VIEWS LEFT; Future -     Meloxicam; Take 1 tablet (7.5 mg total) by mouth daily.  Dispense: 30 tablet; Refill: 0   1.Review of health maintenance:  -Covid vaccine/booster: Declined -Tdap vaccine: Declined -Cologuard or Colonoscopy:Declined  -Hep C/HIV screening: Declined  -Influenza vaccine: Declined  -Zoster vaccine: Declined  -PNA vaccine: Declined  2. Prescribed Meloxicam 7.5mg  daily for left hip pain. Suspect pain  is from osteoarthritis.  Recommend to not take additional NSAIDS, such as Advil, Ibuprofen, Aleve, and Naproxen. Recommend to eat something when taking medication. 3.Ordered left hip x-ray. Please go to the following location and office will call with results.   [facility] or xray: Walk in 8:30-4:30 during weekdays, no appointment needed [address]  [place], [place] [address]  4.Ordered CMP to assess kidney function since starting Meloxicam.  5.Follow up in 1 month to see how his pain is doing with starting Meloxicam.  Return in about 1 month (around 04/02/2023) for follow-up: [facility] .   Zandra Abts, NP

## 2023-03-06 ENCOUNTER — Telehealth: Payer: Self-pay

## 2023-03-06 ENCOUNTER — Ambulatory Visit: Payer: No Typology Code available for payment source | Admitting: Family Medicine

## 2023-03-06 ENCOUNTER — Other Ambulatory Visit: Payer: Self-pay

## 2023-03-06 ENCOUNTER — Encounter: Payer: Self-pay | Admitting: Family Medicine

## 2023-03-06 DIAGNOSIS — M25552 Pain in left hip: Secondary | ICD-10-CM

## 2023-03-06 NOTE — Telephone Encounter (Signed)
He has scheduled a F/U appt for today because he would also like a referral to a specialist. He is worried with his line of work, that climbing ladders is going to aggravate the hip pain he is already experiencing.

## 2023-03-06 NOTE — Telephone Encounter (Signed)
Sent to [employer] [employer].

## 2023-03-07 NOTE — Telephone Encounter (Signed)
Printed form and placed in cabinet at front desk. Pt has been informed

## 2023-03-07 NOTE — Telephone Encounter (Signed)
-----   Message from Zandra Abts sent at 03/07/2023  9:30 AM EDT ----- Your hip x-ray shows degenerative changes in the hip, with possible femoroacetabular impingement, where the femoral head of the hip could be rubbing against bone with certain movements. We have referred you to ortho, awaiting on appointment, who will be better at looking at this further. Also, started Meloxicam 7.5mg  daily. In the meantime, we could increase this dose to 15mg  daily or prescribe a dose of Prednisone to see if this helps. Please let me know if you would like to either one.

## 2023-03-07 NOTE — Progress Notes (Signed)
Results sent via MyChart. Pt states this is the way to contact him.

## 2023-03-15 ENCOUNTER — Encounter: Payer: Self-pay | Admitting: Orthopedic Surgery

## 2023-03-15 ENCOUNTER — Ambulatory Visit: Payer: No Typology Code available for payment source | Admitting: Orthopedic Surgery

## 2023-03-15 VITALS — BP 152/100 | HR 73 | Ht 74.0 in | Wt 236.0 lb

## 2023-03-15 DIAGNOSIS — M1612 Unilateral primary osteoarthritis, left hip: Secondary | ICD-10-CM

## 2023-03-15 NOTE — Patient Instructions (Signed)
Please provide a work note for today's visit  Also, please provide a note for work  He should be limited to light duty at work.  Limit climbing up and down ladders, and in and out of trucks.  Please continue with these restrictions until he is re-evaluated by a hip replacement specialist.

## 2023-03-15 NOTE — Progress Notes (Signed)
New Patient Visit  Assessment: Allen Herring is a 62 y.o. male with the following: 1. Arthritis of left hip  Plan: Lonzo Ihnen has advanced degenerative changes in the left hip.  Pain has been progressively worsening.  He has had difficulty over the past month.  He is currently taking meloxicam.  We discussed proceeding with an injection, but he would like to discuss his ongoing pain with Dr. Magnus Ivan, specifically to discuss a hip replacement.  He notes that he has a poor quality of life.  Currently, he is not doing the things he wants to do.  He has remained on light duty at work for several weeks.  I think he is a good candidate to consider hip replacement.  Will place the referral.  If he has issues, he will contact clinic.  Follow-up: Return for Referral Dr. Magnus Ivan.  Subjective:  Chief Complaint  Patient presents with   Hip Pain    L hip pain for 1 mo pt has been taking mobic with minor relief.     History of Present Illness: Allen Herring is a 62 y.o. male who has been referred by  Zandra Abts, NP for evaluation of left hip pain.  He has had pain in the left groin for the past 1-2 months.  No specific injury.  He notes occasional issues with his left hip in years past.  He was recently evaluated by his primary care doctor, and provided meloxicam.  This has not provided sustained relief.  He has unable to complete all of his duties at work recently.  In addition, he has been unable to ride his motorcycle.  He can get he has difficulty putting his shoes and socks on the left foot.  No prior injections.  He has not worked with a Paramedic.   Review of Systems: No fevers or chills No numbness or tingling No chest pain No shortness of breath No bowel or bladder dysfunction No GI distress No headaches   Medical History:  Past Medical History:  Diagnosis Date   Diverticulitis    GERD (gastroesophageal reflux disease)    History of cholecystectomy     Hypertension    Polycythemia 10/17/2011    Past Surgical History:  Procedure Laterality Date   CERVICAL SPINE SURGERY  05/2016   CHOLECYSTECTOMY N/A 05/19/2022   Procedure: LAPAROSCOPIC CHOLECYSTECTOMY;  Surgeon: Lucretia Roers, MD;  Location: [facility];  Service: General;  Laterality: N/A;   Dental extractions      Family History  Problem Relation Age of Onset   Alzheimer's disease Mother    Fibromyalgia Sister    Hypertension Brother    Cancer Maternal Grandfather        head and neck cancer   Cancer Paternal Grandmother    Social History   Tobacco Use   Smoking status: Former    Types: Cigars   Smokeless tobacco: Never  Vaping Use   Vaping status: Never Used  Substance Use Topics   Alcohol use: No    Comment: 2x year   Drug use: No    No Known Allergies  Current Meds  Medication Sig   lisinopril (ZESTRIL) 20 MG tablet Take 1 tablet (20 mg total) by mouth daily. (NEEDS TO BE SEEN BEFORE NEXT REFILL)   meloxicam (MOBIC) 7.5 MG tablet Take 1 tablet (7.5 mg total) by mouth daily.    Objective: BP (!) 152/100   Pulse 73   Ht 6\' 2"  (1.88 m)   Wt 236 lb (107  kg)   BMI 30.30 kg/m   Physical Exam:  General: Alert and oriented. and No acute distress. Gait: Left sided antalgic gait.  Evaluation of left hip demonstrates no deformity.  He does have a contracture.  When laying on his back, his hip is flexed to approximately 15-20 degrees.  On his back, he tolerates flexion to approximately 90 degrees.  In this position, he has very little internal rotation.  He does tolerate 15-20 degrees of external rotation with limited pain in the groin.  Sensation is intact distally.  Toes warm well-perfused.  He has excellent strength in bilateral lower extremities.  Minimal pain in the right groin with internal and external rotation of the right hip.  IMAGING: I personally reviewed images previously obtained in clinic  X-rays were previously obtained.  Advanced degenerative  changes of the left hip.  No bony lesions.  Mild to moderate degenerative changes in the right hip.   New Medications:  No orders of the defined types were placed in this encounter.     Oliver Barre, MD  03/15/2023 1:55 PM

## 2023-03-23 ENCOUNTER — Telehealth: Payer: Self-pay

## 2023-03-23 NOTE — Telephone Encounter (Signed)
Patient called in asking for medication for swelling/inflammation in his hip until he can make it to orthopedist for his replacement, appt on 04/03/23  Please advise

## 2023-03-24 NOTE — Telephone Encounter (Signed)
Spoke with pt, stated he will call the ortho office if he needs something for pain before his appt on 10/28

## 2023-04-02 NOTE — Progress Notes (Deleted)
   Established Patient Office Visit   Subjective:  Patient ID: Allen Herring, male    DOB: February 03, 1961  Age: 62 y.o. MRN: [id number]  No chief complaint on file.   HPI  ROS See HPI above     Objective:     There were no vitals taken for this visit. {Vitals History (Optional):23777}  Physical Exam  No results found for any visits on 04/03/23.  The 10-year ASCVD risk score (Arnett DK, et al., 2019) is: 21.4%    Assessment & Plan:  There are no diagnoses linked to this encounter.  No follow-ups on file.   Zandra Abts, NP

## 2023-04-03 ENCOUNTER — Other Ambulatory Visit: Payer: Self-pay

## 2023-04-03 ENCOUNTER — Ambulatory Visit: Payer: No Typology Code available for payment source | Admitting: Family Medicine

## 2023-04-03 ENCOUNTER — Ambulatory Visit (INDEPENDENT_AMBULATORY_CARE_PROVIDER_SITE_OTHER): Payer: No Typology Code available for payment source | Admitting: Orthopaedic Surgery

## 2023-04-03 ENCOUNTER — Encounter: Payer: Self-pay | Admitting: Orthopaedic Surgery

## 2023-04-03 VITALS — Ht 74.0 in | Wt 236.0 lb

## 2023-04-03 DIAGNOSIS — M25552 Pain in left hip: Secondary | ICD-10-CM

## 2023-04-03 DIAGNOSIS — M1612 Unilateral primary osteoarthritis, left hip: Secondary | ICD-10-CM | POA: Diagnosis not present

## 2023-04-03 DIAGNOSIS — I10 Essential (primary) hypertension: Secondary | ICD-10-CM

## 2023-04-03 MED ORDER — MELOXICAM 7.5 MG PO TABS: 7.5000 mg | ORAL_TABLET | Freq: Every day | ORAL | 0 refills | Status: DC

## 2023-04-03 MED ORDER — TRAMADOL HCL 50 MG PO TABS: 50.0000 mg | ORAL_TABLET | Freq: Four times a day (QID) | ORAL | 0 refills | Status: AC | PRN

## 2023-04-03 MED ORDER — LISINOPRIL 20 MG PO TABS: 20.0000 mg | ORAL_TABLET | Freq: Every day | ORAL | 2 refills | Status: DC

## 2023-04-03 NOTE — Telephone Encounter (Signed)
Pt calling states he does not need the meloxicam he needs the  lisinopril (ZESTRIL) 20 MG tablet

## 2023-04-03 NOTE — Telephone Encounter (Signed)
Refill sent to pharmacy for Lisinopril

## 2023-04-03 NOTE — Progress Notes (Signed)
The patient is a very pleasant and active 63 year old gentleman who was sent to me from Dr. Thane Edu from [facility] to evaluate and treat significant arthritis of the left hip.  The patient has been having worsening hip pain for many years now and has gotten to where it is detrimentally affecting his mobility, his quality of life and his actives daily living.  He has tried and failed all forms of conservative treatment for over a year now.  It is slowing him down quite a bit.  He has a harder time putting on his shoes and socks.  He has pain that wakes him up at night.  He has been on meloxicam as well.  His wife is with him today.  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  He does have high blood pressure but this is well treated with medications.  I was able to review all of his past medical history and medications within epic and there are x-rays on the canopy system as well of his pelvis and left hip.  On exam his right hip moves smoothly and fluidly with no pain in the groin and no block to rotation.  His left hip has significant stiffness and significant pain in the groin with attempts of rotating the left hip.  I cannot fully rotate internally or externally the left hip.  X-rays of the pelvis and left hip show bone-on-bone wear of the left hip.  There is almost complete loss of the joint space with osteophytes around the hip.  There is superior lateral flattening of the femoral head and evidence of previous femoral acetabular impingement that is gotten worse.  There is also sclerotic changes.  There is mild arthritis in his right hip.  At this point we did talk to him about hip replacement surgery in detail.  I discussed the risk and benefits of the surgery and what to expect from an intraoperative and postoperative standpoint.  We went over his x-rays and a hip replacement model and gave him a handout about hip replacement surgery.  He is hoping to get on the schedule very  soon.  He and his wife have a cruise to go on later in January.  He is hoping to have the surgery well before then and I agree with at least looking into it.  All questions and concerns were answered and addressed.  Will be in touch with scheduling surgery.

## 2023-05-03 ENCOUNTER — Telehealth: Payer: Self-pay | Admitting: Family Medicine

## 2023-05-03 NOTE — Telephone Encounter (Signed)
Pt called to follow up on some paperwork Pt states NP was supposed to complete?  Please return his call, at your earliest convenience.

## 2023-05-03 NOTE — Telephone Encounter (Signed)
An office visit has been scheduled.

## 2023-05-08 ENCOUNTER — Ambulatory Visit: Payer: No Typology Code available for payment source | Admitting: Family Medicine

## 2023-05-08 ENCOUNTER — Encounter: Payer: Self-pay | Admitting: Family Medicine

## 2023-05-08 VITALS — BP 142/90 | HR 70 | Temp 98.1°F | Wt 234.0 lb

## 2023-05-08 DIAGNOSIS — M25552 Pain in left hip: Secondary | ICD-10-CM

## 2023-05-08 DIAGNOSIS — I10 Essential (primary) hypertension: Secondary | ICD-10-CM | POA: Diagnosis not present

## 2023-05-08 DIAGNOSIS — G8929 Other chronic pain: Secondary | ICD-10-CM

## 2023-05-08 LAB — POCT GLYCOSYLATED HEMOGLOBIN (HGB A1C): Hemoglobin A1C: 5.4 % (ref 4.0–5.6)

## 2023-05-08 MED ORDER — LISINOPRIL-HYDROCHLOROTHIAZIDE 20-25 MG PO TABS: 1.0000 | ORAL_TABLET | Freq: Every day | ORAL | 0 refills | Status: DC

## 2023-05-08 NOTE — Progress Notes (Signed)
   Subjective:    Patient ID: Allen Herring, male    DOB: May 11, 1961, 62 y.o.   MRN: [id number]  HPI Here for presurgical evaluation. He will be scheduled for a left total hip replacement soon per Dr. Weber Cooks. Other than hip pain, he feels fine. His BP has been under borderline control at home, averaging 140/90. He had a normal EKG one year ago. A1c today is 5.4%.    Review of Systems  Constitutional: Negative.   Respiratory: Negative.    Cardiovascular: Negative.   Gastrointestinal: Negative.   Genitourinary: Negative.   Musculoskeletal:  Positive for arthralgias.  Neurological: Negative.        Objective:   Physical Exam Constitutional:      Appearance: Normal appearance.  Cardiovascular:     Rate and Rhythm: Normal rate and regular rhythm.     Pulses: Normal pulses.     Heart sounds: Normal heart sounds.  Pulmonary:     Effort: Pulmonary effort is normal.     Breath sounds: Normal breath sounds.  Neurological:     Mental Status: He is alert.           Assessment & Plan:  Presurgical clearance. He is cleared with no restrictions. For the HTN, we will change his medication to Lisinopril HCT 20-25 daily. He will follow up with his PCP, Zandra Abts NP, in 90 days. We spent a total of ( 34  ) minutes reviewing records and discussing these issues.  Gershon Crane, MD  Gershon Crane, MD

## 2023-05-08 NOTE — Addendum Note (Signed)
Addended by: Carola Rhine on: 05/08/2023 03:41 PM   Modules accepted: Orders

## 2023-05-29 ENCOUNTER — Ambulatory Visit: Payer: No Typology Code available for payment source | Attending: Orthopedic Surgery | Admitting: Physical Therapy

## 2023-05-29 ENCOUNTER — Other Ambulatory Visit: Payer: Self-pay

## 2023-05-29 ENCOUNTER — Encounter: Payer: Self-pay | Admitting: Physical Therapy

## 2023-05-29 DIAGNOSIS — M25552 Pain in left hip: Secondary | ICD-10-CM | POA: Diagnosis present

## 2023-05-29 DIAGNOSIS — M6281 Muscle weakness (generalized): Secondary | ICD-10-CM | POA: Diagnosis present

## 2023-05-29 DIAGNOSIS — M25662 Stiffness of left knee, not elsewhere classified: Secondary | ICD-10-CM | POA: Insufficient documentation

## 2023-05-29 NOTE — Therapy (Signed)
OUTPATIENT PHYSICAL THERAPY LOWER EXTREMITY EVALUATION   Patient Name: Allen Herring MRN: [id number] DOB:Aug 01, 1960, 62 y.o., male Today's Date: 05/29/2023  END OF SESSION:  PT End of Session - 05/29/23 0918     Visit Number 1    Number of Visits 12    Date for PT Re-Evaluation 07/10/23    PT Start Time 0853    PT Stop Time 0918    PT Time Calculation (min) 25 min    Activity Tolerance Patient tolerated treatment well    Behavior During Therapy [facility] for tasks assessed/performed             Past Medical History:  Diagnosis Date   Diverticulitis    GERD (gastroesophageal reflux disease)    History of cholecystectomy    Hypertension    Polycythemia 10/17/2011   Past Surgical History:  Procedure Laterality Date   CERVICAL SPINE SURGERY  05/2016   CHOLECYSTECTOMY N/A 05/19/2022   Procedure: LAPAROSCOPIC CHOLECYSTECTOMY;  Surgeon: Lucretia Roers, MD;  Location: [facility];  Service: General;  Laterality: N/A;   Dental extractions     Patient Active Problem List   Diagnosis Date Noted   Cholecystitis, acute 05/19/2022   Biliary sludge 05/15/2022   Hyperlipidemia 07/03/2019   BMI 30.0-30.9,adult 08/29/2018   Carpal tunnel syndrome of left wrist 10/17/2017   Ulnar nerve external compression syndrome, left 10/17/2017   Allergic rhinitis 08/07/2017   Hypertension 10/18/2016   REFERRING PROVIDER: Weber Cooks MD  REFERRING DIAG: S/p left posterior total hip replacement.  THERAPY DIAG:  Pain in left hip  Muscle weakness (generalized)  Stiffness of left knee, not elsewhere classified  Rationale for Evaluation and Treatment: Rehabilitation  ONSET DATE: 05/26/23 (surgery date).  SUBJECTIVE:   SUBJECTIVE STATEMENT: The patient presents to the clinic s/p left posterior hip replacement performed on 05/26/23. He is pleased with his outcome thus far.  He is reporting mild pain at this time.  He has an HEP that includes ankle pumps, quad sets and attempted SLR's.   He is walking safely with a FWW.    PERTINENT HISTORY: ACDF. PAIN:  Are you having pain? Yes: NPRS scale: 2-3/10. Pain location: Left hip. Pain description: Ache. Aggravating factors: Movement. Relieving factors: Rest.  PRECAUTIONS: Posterior hip.  No ultrasound.   WEIGHT BEARING RESTRICTIONS: No  FALLS:  Has patient fallen in last 6 months? No  LIVING ENVIRONMENT: Lives in: House/apartment Has following equipment at home: Walker - 2 wheeled  PLOF: Independent  PATIENT GOALS: Get around without left hip pain.    OBJECTIVE:  Note: Objective measures were completed at Evaluation unless otherwise noted.   PATIENT SURVEYS:  FOTO 38.17.   PALPATION: Left hip Aquacel intact.  Tender to palpation over left proximal gluteal musculature.  LOWER EXTREMITY ROM:  In supine:  Left knee extension is -10 degrees.  LOWER EXTREMITY MMT:  Left knee extension strength is graded grossly at 3-/5.  Left hip abduction ~3/5.    TRANSITIONS:  Min+ assist provided to patient to transition from sit to supine to sit.    GAIT: The patient is walking safely with a FWW with a decrease in step and stride length.  Stairs performed in a non-reciprocating fashion without difficulty.  PATIENT EDUCATION:  Education details: Reviewed HEP established in hospital prior to discharge. Person educated: Patient Education method: Medical illustrator Education comprehension: verbalized understanding and returned demonstration  HOME EXERCISE PROGRAM: As above.  ASSESSMENT:  CLINICAL IMPRESSION:  The patient presents to the clinic s/p left posterior total hip replacement performed on 05/26/23.  He is doing very well and is pleased with his surgical outcome thus far.  He is walking safely with a FWW with a decrease in step and stride length and performs stairs  with a non-reciprocating fashion without difficulty.  Transition from sit to supine to sit require minimal+ assist to his left LE.  His Aquacel is intact and he is tender to palpation over his proximal gluteal musculature.  He has been compliant to a HEP.  He lacks 10 degrees of left knee extension.  His FOTO limitation score is 38.17.  Patient will benefit from skilled physical therapy intervention to address pain and deficits.  OBJECTIVE IMPAIRMENTS: Abnormal gait, decreased activity tolerance, decreased mobility, decreased ROM, decreased strength, increased muscle spasms, and pain.   ACTIVITY LIMITATIONS: carrying, lifting, bending, sleeping, bed mobility, and locomotion level  PARTICIPATION LIMITATIONS: meal prep, cleaning, laundry, driving, shopping, community activity, occupation, and yard work  PERSONAL FACTORS: Time since onset of injury/illness/exacerbation are also affecting patient's functional outcome.   REHAB POTENTIAL: Excellent  CLINICAL DECISION MAKING: Stable/uncomplicated  EVALUATION COMPLEXITY: Low   GOALS:  LONG TERM GOALS: Target date: 07/10/23.  Ind with a HEP. Goal status: INITIAL  2.  Full active left knee extension.  Goal status: INITIAL  3.  Improve LE strength to a solid 4+/5 to increase stability for functional tasks.  Goal status: INITIAL  4.  Perform a reciprocating stair gait with one railing.  Goal status: INITIAL  5.  Walk a community distance without assistive device.  Goal status: INITIAL  6.  Perform ADL's with pain not >2-3/10.  Goal status: INITIAL   PLAN:  PT FREQUENCY: 2x/week  PT DURATION: 6 weeks  PLANNED INTERVENTIONS: 97110-Therapeutic exercises, 97530- Therapeutic activity, [id number]- Neuromuscular re-education, 97535- Self Care, 30865- Manual therapy, 97014- Electrical stimulation (unattended), Patient/Family education, Stair training, Cryotherapy, and Moist heat  PLAN FOR NEXT SESSION: Nustep, gait activities.  Left knee  extension stretching, left LE strengthening.     Lee-Ann Gal, [place], PT 05/29/2023, 9:43 AM

## 2023-06-01 ENCOUNTER — Ambulatory Visit: Payer: No Typology Code available for payment source | Admitting: Physical Therapy

## 2023-06-01 DIAGNOSIS — M25552 Pain in left hip: Secondary | ICD-10-CM

## 2023-06-01 DIAGNOSIS — M25662 Stiffness of left knee, not elsewhere classified: Secondary | ICD-10-CM

## 2023-06-01 DIAGNOSIS — M6281 Muscle weakness (generalized): Secondary | ICD-10-CM

## 2023-06-01 NOTE — Therapy (Signed)
OUTPATIENT PHYSICAL THERAPY LOWER EXTREMITY EVALUATION   Patient Name: Allen Herring MRN: [id number] DOB:07-Aug-1960, 62 y.o., male Today's Date: 06/01/2023  END OF SESSION:  PT End of Session - 06/01/23 0942     Visit Number 2    Number of Visits 12    Date for PT Re-Evaluation 07/10/23    PT Start Time 0845    PT Stop Time 0930    PT Time Calculation (min) 45 min    Activity Tolerance Patient tolerated treatment well    Behavior During Therapy [facility] for tasks assessed/performed              Past Medical History:  Diagnosis Date   Diverticulitis    GERD (gastroesophageal reflux disease)    History of cholecystectomy    Hypertension    Polycythemia 10/17/2011   Past Surgical History:  Procedure Laterality Date   CERVICAL SPINE SURGERY  05/2016   CHOLECYSTECTOMY N/A 05/19/2022   Procedure: LAPAROSCOPIC CHOLECYSTECTOMY;  Surgeon: Lucretia Roers, MD;  Location: [facility];  Service: General;  Laterality: N/A;   Dental extractions     Patient Active Problem List   Diagnosis Date Noted   Cholecystitis, acute 05/19/2022   Biliary sludge 05/15/2022   Hyperlipidemia 07/03/2019   BMI 30.0-30.9,adult 08/29/2018   Carpal tunnel syndrome of left wrist 10/17/2017   Ulnar nerve external compression syndrome, left 10/17/2017   Allergic rhinitis 08/07/2017   Hypertension 10/18/2016   REFERRING PROVIDER: Weber Cooks MD  REFERRING DIAG: S/p left posterior total hip replacement.  THERAPY DIAG:  Pain in left hip  Muscle weakness (generalized)  Stiffness of left knee, not elsewhere classified  Rationale for Evaluation and Treatment: Rehabilitation  ONSET DATE: 05/26/23 (surgery date).  SUBJECTIVE:   SUBJECTIVE STATEMENT: The patient presents to the clinic s/p left posterior hip replacement performed on 05/26/23. He is pleased with his outcome thus far.  He is reporting mild pain at this time.  He has an HEP that includes ankle pumps, quad sets and attempted  SLR's.  He is walking safely with a FWW.    PERTINENT HISTORY: ACDF. PAIN:  Are you having pain? Yes: NPRS scale: 2-3/10. Pain location: Left hip. Pain description: Ache. Aggravating factors: Movement. Relieving factors: Rest.  PRECAUTIONS: Posterior hip.  No ultrasound.   WEIGHT BEARING RESTRICTIONS: No  FALLS:  Has patient fallen in last 6 months? No  LIVING ENVIRONMENT: Lives in: House/apartment Has following equipment at home: Walker - 2 wheeled  PLOF: Independent  PATIENT GOALS: Get around without left hip pain.    OBJECTIVE:  Note: Objective measures were completed at Evaluation unless otherwise noted.   PATIENT SURVEYS:  FOTO 38.17.   PALPATION: Left hip Aquacel intact.  Tender to palpation over left proximal gluteal musculature.  LOWER EXTREMITY ROM:  In supine:  Left knee extension is -10 degrees.  LOWER EXTREMITY MMT:  Left knee extension strength is graded grossly at 3-/5.  Left hip abduction ~3/5.    TRANSITIONS:  Min+ assist provided to patient to transition from sit to supine to sit.    GAIT: The patient is walking safely with a FWW with a decrease in step and stride length.  Stairs performed in a non-reciprocating fashion without difficulty.  PATIENT EDUCATION: See below.Reviewed HEP established in hospital prior to discharge. Person educated: Patient Education method: Medical illustrator Education comprehension: verbalized understanding and returned demonstration, handout  HOME EXERCISE PROGRAM Created by [place] Eldrige Pitkin Dec 26th, 2024 View at [email or web address] using code: [email or web address]  Page 1 of 1 3 Exercises  hip abduction Exercise is done supine with knee's bent and feet flat. Pull you belly into your spine, and perform the exercise with no pain Repeat 10 Times Hold 10 Seconds Complete 2  Sets Perform 2 Times a Day PEANUT BALL - SHORT ARC QUAD - SAQ  Start by placing a peanut ball (rolled up pillow) under your knee. Next, slowly raise your lower leg and foot as you straighten your knee. Return to starting position and repeat. Repeat 20 Times Hold 3 Seconds Complete 2 Sets Perform 2 Times a Day LONG ARC QUAD - LAQ - HIGH SEAT While seated with your knee in a bent position, slowly straighten your knee as you raise your foot upwards as shown. Then bend it back as far as you can. Repeat 10 Times Hold 5 Seconds Complete 2 Sets Perform 4 Times a Day  TODAY's TX:                                     EXERCISE LOG  Exercise Repetitions and Resistance Comments  Nustep seat on 15 Level 3 x 15 minutes   Rockerboard 5 minutes   Supine hip abduction Yellow theraband x 3 minutes   SAQ's 5 minutes with 2#   LAQ's 5 minutes 2#   In supine:  Left knee passive extension stretching x 5 minutes  ASSESSMENT:  CLINICAL IMPRESSION: Patient did an excellent job today.  He is now able to perform a SAQ and LAQ with 2#.  Provided patient with yellow theraband for a home exercise.  OBJECTIVE IMPAIRMENTS: Abnormal gait, decreased activity tolerance, decreased mobility, decreased ROM, decreased strength, increased muscle spasms, and pain.   ACTIVITY LIMITATIONS: carrying, lifting, bending, sleeping, bed mobility, and locomotion level  PARTICIPATION LIMITATIONS: meal prep, cleaning, laundry, driving, shopping, community activity, occupation, and yard work  PERSONAL FACTORS: Time since onset of injury/illness/exacerbation are also affecting patient's functional outcome.   REHAB POTENTIAL: Excellent  CLINICAL DECISION MAKING: Stable/uncomplicated  EVALUATION COMPLEXITY: Low   GOALS:  LONG TERM GOALS: Target date: 07/10/23.  Ind with a HEP. Goal status: INITIAL  2.  Full active left knee extension.  Goal status: INITIAL  3.  Improve LE strength to a solid 4+/5 to increase stability  for functional tasks.  Goal status: INITIAL  4.  Perform a reciprocating stair gait with one railing.  Goal status: INITIAL  5.  Walk a community distance without assistive device.  Goal status: INITIAL  6.  Perform ADL's with pain not >2-3/10.  Goal status: INITIAL   PLAN:  PT FREQUENCY: 2x/week  PT DURATION: 6 weeks  PLANNED INTERVENTIONS: 97110-Therapeutic exercises, 97530- Therapeutic activity, [id number]- Neuromuscular re-education, 97535- Self Care, 21308- Manual therapy, 97014- Electrical stimulation (unattended), Patient/Family education, Stair training, Cryotherapy, and Moist heat  PLAN FOR NEXT SESSION: Nustep, gait activities.  Left knee extension stretching, left LE strengthening.     Jhamal Plucinski, [place], PT 06/01/2023, 9:42 AM

## 2023-06-06 ENCOUNTER — Ambulatory Visit: Payer: No Typology Code available for payment source

## 2023-06-06 DIAGNOSIS — M25552 Pain in left hip: Secondary | ICD-10-CM

## 2023-06-06 DIAGNOSIS — M6281 Muscle weakness (generalized): Secondary | ICD-10-CM

## 2023-06-06 NOTE — Therapy (Signed)
 OUTPATIENT PHYSICAL THERAPY LOWER EXTREMITY TREATMENT   Patient Name: Allen Herring MRN: [id number] DOB:[birth year], 62 y.o., male Today's Date: 06/06/2023  END OF SESSION:  PT End of Session - 06/06/23 0810     Visit Number 3    Number of Visits 12    Date for PT Re-Evaluation 07/10/23    PT Start Time 0809    Activity Tolerance Patient tolerated treatment well    Behavior During Therapy [facility] for tasks assessed/performed              Past Medical History:  Diagnosis Date   Diverticulitis    GERD (gastroesophageal reflux disease)    History of cholecystectomy    Hypertension    Polycythemia 10/17/2011   Past Surgical History:  Procedure Laterality Date   CERVICAL SPINE SURGERY  05/2016   CHOLECYSTECTOMY N/A 05/19/2022   Procedure: LAPAROSCOPIC CHOLECYSTECTOMY;  Surgeon: Kallie Manuelita BROCKS, MD;  Location: [facility];  Service: General;  Laterality: N/A;   Dental extractions     Patient Active Problem List   Diagnosis Date Noted   Cholecystitis, acute 05/19/2022   Biliary sludge 05/15/2022   Hyperlipidemia 07/03/2019   BMI 30.0-30.9,adult 08/29/2018   Carpal tunnel syndrome of left wrist 10/17/2017   Ulnar nerve external compression syndrome, left 10/17/2017   Allergic rhinitis 08/07/2017   Hypertension 10/18/2016   REFERRING PROVIDER: Toribio Higashi MD  REFERRING DIAG: S/p left posterior total hip replacement.  THERAPY DIAG:  Pain in left hip  Muscle weakness (generalized)  Rationale for Evaluation and Treatment: Rehabilitation  ONSET DATE: 05/26/23 (surgery date).  SUBJECTIVE:   SUBJECTIVE STATEMENT: Pt reports increased left hip pain of 6/10 today.  Pt reports doing too much yesterday.  PERTINENT HISTORY: ACDF. PAIN:  Are you having pain? Yes: NPRS scale: 6/10. Pain location: Left hip. Pain description: Ache. Aggravating factors: Movement. Relieving factors: Rest.  PRECAUTIONS: Posterior hip.  No ultrasound.   WEIGHT BEARING  RESTRICTIONS: No  FALLS:  Has patient fallen in last 6 months? No  LIVING ENVIRONMENT: Lives in: House/apartment Has following equipment at home: Walker - 2 wheeled  PLOF: Independent  PATIENT GOALS: Get around without left hip pain.    OBJECTIVE:  Note: Objective measures were completed at Evaluation unless otherwise noted.   PATIENT SURVEYS:  FOTO 38.17.   PALPATION: Left hip Aquacel intact.  Tender to palpation over left proximal gluteal musculature.  LOWER EXTREMITY ROM:  In supine:  Left knee extension is -10 degrees.  LOWER EXTREMITY MMT:  Left knee extension strength is graded grossly at 3-/5.  Left hip abduction ~3/5.    TRANSITIONS:  Min+ assist provided to patient to transition from sit to supine to sit.    GAIT: The patient is walking safely with a FWW with a decrease in step and stride length.  Stairs performed in a non-reciprocating fashion without difficulty.  PATIENT EDUCATION: See below.Reviewed HEP established in hospital prior to discharge. Person educated: Patient Education method: Medical Illustrator Education comprehension: verbalized understanding and returned demonstration, handout  HOME EXERCISE PROGRAM Created by Chad Applegate Dec 26th, 2024 View at [email or web address] using code: [email or web address]  Page 1 of 1 3 Exercises  hip abduction Exercise is done supine with knee's bent and feet flat. Pull you belly into your spine, and perform the exercise with no pain Repeat 10 Times Hold 10 Seconds Complete 2 Sets Perform 2 Times a Day PEANUT BALL - SHORT ARC QUAD - SAQ  Start by placing a peanut ball (rolled up pillow) under your knee. Next, slowly raise your lower leg and foot as you straighten your knee. Return to starting position and repeat. Repeat 20 Times Hold 3 Seconds Complete 2 Sets Perform 2  Times a Day LONG ARC QUAD - LAQ - HIGH SEAT While seated with your knee in a bent position, slowly straighten your knee as you raise your foot upwards as shown. Then bend it back as far as you can. Repeat 10 Times Hold 5 Seconds Complete 2 Sets Perform 4 Times a Day  TODAY's TX:                06/06/23                    EXERCISE LOG  Exercise Repetitions and Resistance Comments  Nustep  Level 4 x 15 minutes; seat 14   Weight Shifts Airex x 2 mins   Rockerboard 5 minutes   Supine hip abduction Red theraband x 3 minutes   SAQ's    LAQ's 5 minutes 3#    Modalities  Date:  Unattended Estim: Hip, IFC 80-150 Hz, 15 mins, Pain Cold Pack: Hip, 15 mins, Pain and Edema   ASSESSMENT:  CLINICAL IMPRESSION:  Pt arrives for today's treatment session reporting 6/10 left hip pain.  Pt reports doing too much yesterday and attributes his increase in pain to that.  Pt able to progress to level 4 on the Nustep for warm up as well as moving forward to seat 14 today.  Pt reports issues with putting full weight through LLE so introduced to weight shifts on Airex pad with good results.  Pt ambulates around the facility without use of AD.  Pt able to tolerate increased weight/resistance with seated exercises.  Normal responses to estim and ice pack noted upon removal.  Pt has appointment with MD later this week.  Pt reported 2-3/10 pain at completion of today's treatment session.   OBJECTIVE IMPAIRMENTS: Abnormal gait, decreased activity tolerance, decreased mobility, decreased ROM, decreased strength, increased muscle spasms, and pain.   ACTIVITY LIMITATIONS: carrying, lifting, bending, sleeping, bed mobility, and locomotion level  PARTICIPATION LIMITATIONS: meal prep, cleaning, laundry, driving, shopping, community activity, occupation, and yard work  PERSONAL FACTORS: Time since onset of injury/illness/exacerbation are also affecting patient's functional outcome.   REHAB POTENTIAL:  Excellent  CLINICAL DECISION MAKING: Stable/uncomplicated  EVALUATION COMPLEXITY: Low   GOALS:  LONG TERM GOALS: Target date: 07/10/23.  Ind with a HEP. Goal status: INITIAL  2.  Full active left knee extension.  Goal status: INITIAL  3.  Improve LE strength to a solid 4+/5 to increase stability for functional tasks.  Goal status: INITIAL  4.  Perform a reciprocating stair gait with one railing.  Goal status: INITIAL  5.  Walk a community distance without assistive device.  Goal status: INITIAL  6.  Perform ADL's  with pain not >2-3/10.  Goal status: INITIAL   PLAN:  PT FREQUENCY: 2x/week  PT DURATION: 6 weeks  PLANNED INTERVENTIONS: 97110-Therapeutic exercises, 97530- Therapeutic activity, [id number]- Neuromuscular re-education, 97535- Self Care, [id number]- Manual therapy, 97014- Electrical stimulation (unattended), Patient/Family education, Stair training, Cryotherapy, and Moist heat  PLAN FOR NEXT SESSION: Nustep, gait activities.  Left knee extension stretching, left LE strengthening.     Delon DELENA Gosling, PTA 06/06/2023, 9:12 AM

## 2023-06-09 ENCOUNTER — Ambulatory Visit: Payer: No Typology Code available for payment source | Attending: Orthopedic Surgery

## 2023-06-09 DIAGNOSIS — M25662 Stiffness of left knee, not elsewhere classified: Secondary | ICD-10-CM | POA: Diagnosis present

## 2023-06-09 DIAGNOSIS — M6281 Muscle weakness (generalized): Secondary | ICD-10-CM | POA: Diagnosis present

## 2023-06-09 DIAGNOSIS — M25552 Pain in left hip: Secondary | ICD-10-CM

## 2023-06-09 NOTE — Therapy (Signed)
 OUTPATIENT PHYSICAL THERAPY LOWER EXTREMITY TREATMENT   Patient Name: Allen Herring MRN: [id number] DOB:Aug 18, 1960, 63 y.o., male Today's Date: 06/09/2023  END OF SESSION:  PT End of Session - 06/09/23 0803     Visit Number 4    Number of Visits 12    Date for PT Re-Evaluation 07/10/23    PT Start Time 0800    PT Stop Time 0850    PT Time Calculation (min) 50 min    Activity Tolerance Patient tolerated treatment well    Behavior During Therapy [facility] for tasks assessed/performed               Past Medical History:  Diagnosis Date   Diverticulitis    GERD (gastroesophageal reflux disease)    History of cholecystectomy    Hypertension    Polycythemia 10/17/2011   Past Surgical History:  Procedure Laterality Date   CERVICAL SPINE SURGERY  05/2016   CHOLECYSTECTOMY N/A 05/19/2022   Procedure: LAPAROSCOPIC CHOLECYSTECTOMY;  Surgeon: Kallie Manuelita BROCKS, MD;  Location: [facility];  Service: General;  Laterality: N/A;   Dental extractions     Patient Active Problem List   Diagnosis Date Noted   Cholecystitis, acute 05/19/2022   Biliary sludge 05/15/2022   Hyperlipidemia 07/03/2019   BMI 30.0-30.9,adult 08/29/2018   Carpal tunnel syndrome of left wrist 10/17/2017   Ulnar nerve external compression syndrome, left 10/17/2017   Allergic rhinitis 08/07/2017   Hypertension 10/18/2016   REFERRING PROVIDER: Toribio Higashi MD  REFERRING DIAG: S/p left posterior total hip replacement.  THERAPY DIAG:  Pain in left hip  Muscle weakness (generalized)  Stiffness of left knee, not elsewhere classified  Rationale for Evaluation and Treatment: Rehabilitation  ONSET DATE: 05/26/23 (surgery date).  SUBJECTIVE:   SUBJECTIVE STATEMENT: Patient reports that he is a little sore today. However, his surgeon cleared him to drive and do whatever he wants. He still has one more follow up with his surgeon.   PERTINENT HISTORY: ACDF. PAIN:  Are you having pain? Yes: NPRS scale:  4/10. Pain location: Left hip. Pain description: Ache. Aggravating factors: Movement. Relieving factors: Rest.  PRECAUTIONS: Posterior hip.  No ultrasound.   WEIGHT BEARING RESTRICTIONS: No  FALLS:  Has patient fallen in last 6 months? No  LIVING ENVIRONMENT: Lives in: House/apartment Has following equipment at home: Walker - 2 wheeled  PLOF: Independent  PATIENT GOALS: Get around without left hip pain.    OBJECTIVE:  Note: Objective measures were completed at Evaluation unless otherwise noted.   PATIENT SURVEYS:  FOTO 38.17.   PALPATION: Left hip Aquacel intact.  Tender to palpation over left proximal gluteal musculature.  LOWER EXTREMITY ROM:  In supine:  Left knee extension is -10 degrees.  LOWER EXTREMITY MMT:  Left knee extension strength is graded grossly at 3-/5.  Left hip abduction ~3/5.    TRANSITIONS:  Min+ assist provided to patient to transition from sit to supine to sit.    GAIT: The patient is walking safely with a FWW with a decrease in step and stride length.  Stairs performed in a non-reciprocating fashion without difficulty  PATIENT EDUCATION: See below.Reviewed HEP established in hospital prior to discharge. Person educated: Patient Education method: Medical Illustrator Education comprehension: verbalized understanding and returned demonstration, handout  HOME EXERCISE PROGRAM Created by Chad Applegate Dec 26th, 2024 View at [email or web address] using code: [email or web address]  Page 1 of 1 3 Exercises  hip abduction Exercise is done supine with knee's bent and feet flat. Pull you belly into  your spine, and perform the exercise with no pain Repeat 10 Times Hold 10 Seconds Complete 2 Sets Perform 2 Times a Day PEANUT BALL - SHORT ARC QUAD - SAQ  Start by placing a peanut ball (rolled up pillow) under your knee. Next, slowly raise your lower leg and foot as you straighten your knee. Return to starting position and repeat. Repeat  20 Times Hold 3 Seconds Complete 2 Sets Perform 2 Times a Day LONG ARC QUAD - LAQ - HIGH SEAT While seated with your knee in a bent position, slowly straighten your knee as you raise your foot upwards as shown. Then bend it back as far as you can. Repeat 10 Times Hold 5 Seconds Complete 2 Sets Perform 4 Times a Day  TODAY's TX:                                   06/09/23 EXERCISE LOG  Exercise Repetitions and Resistance Comments  Nustep  L4 x 15 minutes; seat 11   Rocker board  5 minutes BUE support  Marching on foam  3 minutes   Lateral step up and over  4 step x 10 reps    Step up with march  4 step x 4 reps each     Blank cell = exercise not performed today  Modalities: no adverse reaction to today's modalities  Date:  Cold Pack: Hip, 20 mins, Pain                06/06/23                    EXERCISE LOG  Exercise Repetitions and Resistance Comments  Nustep  Level 4 x 15 minutes; seat 14   Weight Shifts Airex x 2 mins   Rockerboard 5 minutes   Supine hip abduction Red theraband x 3 minutes   SAQ's    LAQ's 5 minutes 3#    Modalities  Date:  Unattended Estim: Hip, IFC 80-150 Hz, 15 mins, Pain Cold Pack: Hip, 15 mins, Pain and Edema   ASSESSMENT:  CLINICAL IMPRESSION:  Patent was progressed with step ups and marching on foam for improved functional mobility. He required minimal cueing with marching on foam for improved foot clearance to facilitate increased time in single leg stance. He experienced no increase in pain or soreness with any of today's interventions. He reported feeling good upon the conclusion of treatment. He continues to require skilled physical therapy to address his remaining impairments to return to his prior level of function.   OBJECTIVE IMPAIRMENTS: Abnormal gait, decreased activity tolerance, decreased mobility, decreased ROM, decreased strength, increased muscle spasms, and pain.   ACTIVITY LIMITATIONS: carrying, lifting, bending, sleeping, bed  mobility, and locomotion level  PARTICIPATION LIMITATIONS: meal prep, cleaning, laundry, driving, shopping, community activity, occupation, and yard work  PERSONAL FACTORS: Time since onset of injury/illness/exacerbation are also affecting patient's functional outcome.   REHAB POTENTIAL: Excellent  CLINICAL DECISION MAKING: Stable/uncomplicated  EVALUATION COMPLEXITY: Low   GOALS:  LONG TERM GOALS: Target date: 07/10/23.  Ind with a HEP. Goal status: INITIAL  2.  Full active left knee extension.  Goal status: INITIAL  3.  Improve LE strength to a solid 4+/5 to increase stability for functional tasks.  Goal status: INITIAL  4.  Perform a reciprocating stair gait with one railing.  Goal status: INITIAL  5.  Walk a community distance without assistive  device.  Goal status: INITIAL  6.  Perform ADL's with pain not >2-3/10.  Goal status: INITIAL   PLAN:  PT FREQUENCY: 2x/week  PT DURATION: 6 weeks  PLANNED INTERVENTIONS: 97110-Therapeutic exercises, 97530- Therapeutic activity, [id number]- Neuromuscular re-education, 97535- Self Care, 02859- Manual therapy, 97014- Electrical stimulation (unattended), Patient/Family education, Stair training, Cryotherapy, and Moist heat  PLAN FOR NEXT SESSION: Nustep, gait activities.  Left knee extension stretching, left LE strengthening.     Lacinda JAYSON Fass, PT 06/09/2023, 8:56 AM

## 2023-06-12 ENCOUNTER — Encounter: Payer: Self-pay | Admitting: *Deleted

## 2023-06-12 ENCOUNTER — Ambulatory Visit: Payer: No Typology Code available for payment source | Admitting: *Deleted

## 2023-06-12 DIAGNOSIS — M25552 Pain in left hip: Secondary | ICD-10-CM

## 2023-06-12 DIAGNOSIS — M6281 Muscle weakness (generalized): Secondary | ICD-10-CM

## 2023-06-12 DIAGNOSIS — M25662 Stiffness of left knee, not elsewhere classified: Secondary | ICD-10-CM

## 2023-06-12 NOTE — Therapy (Signed)
 OUTPATIENT PHYSICAL THERAPY LOWER EXTREMITY TREATMENT   Patient Name: Allen Herring MRN: [id number] DOB:[birth year], 63 y.o., male Today's Date: 06/12/2023  END OF SESSION:  PT End of Session - 06/12/23 0807     Visit Number 5    Number of Visits 12    Date for PT Re-Evaluation 07/10/23    PT Start Time 0800    PT Stop Time 0858    PT Time Calculation (min) 58 min               Past Medical History:  Diagnosis Date   Diverticulitis    GERD (gastroesophageal reflux disease)    History of cholecystectomy    Hypertension    Polycythemia 10/17/2011   Past Surgical History:  Procedure Laterality Date   CERVICAL SPINE SURGERY  05/2016   CHOLECYSTECTOMY N/A 05/19/2022   Procedure: LAPAROSCOPIC CHOLECYSTECTOMY;  Surgeon: Kallie Manuelita BROCKS, MD;  Location: [facility];  Service: General;  Laterality: N/A;   Dental extractions     Patient Active Problem List   Diagnosis Date Noted   Cholecystitis, acute 05/19/2022   Biliary sludge 05/15/2022   Hyperlipidemia 07/03/2019   BMI 30.0-30.9,adult 08/29/2018   Carpal tunnel syndrome of left wrist 10/17/2017   Ulnar nerve external compression syndrome, left 10/17/2017   Allergic rhinitis 08/07/2017   Hypertension 10/18/2016   REFERRING PROVIDER: Toribio Higashi MD  REFERRING DIAG: S/p left posterior total hip replacement.  THERAPY DIAG:  Pain in left hip  Muscle weakness (generalized)  Stiffness of left knee, not elsewhere classified  Rationale for Evaluation and Treatment: Rehabilitation  ONSET DATE: 05/26/23 (surgery date).  SUBJECTIVE:   SUBJECTIVE STATEMENT: Patient reports that he is a little sore today. LT hip sore 2/10 PERTINENT HISTORY: ACDF. PAIN:  Are you having pain? Yes: NPRS scale: 2/10. Pain location: Left hip. Pain description: Ache. Aggravating factors: Movement. Relieving factors: Rest.  PRECAUTIONS: Posterior hip.  No ultrasound.   WEIGHT BEARING RESTRICTIONS: No  FALLS:  Has patient  fallen in last 6 months? No  LIVING ENVIRONMENT: Lives in: House/apartment Has following equipment at home: Walker - 2 wheeled  PLOF: Independent  PATIENT GOALS: Get around without left hip pain.    OBJECTIVE:  Note: Objective measures were completed at Evaluation unless otherwise noted.   PATIENT SURVEYS:  FOTO 38.17.   PALPATION: Left hip Aquacel intact.  Tender to palpation over left proximal gluteal musculature.  LOWER EXTREMITY ROM:  In supine:  Left knee extension is -10 degrees.  LOWER EXTREMITY MMT:  Left knee extension strength is graded grossly at 3-/5.  Left hip abduction ~3/5.    TRANSITIONS:  Min+ assist provided to patient to transition from sit to supine to sit.    GAIT: The patient is walking safely with a FWW with a decrease in step and stride length.  Stairs performed in a non-reciprocating fashion without difficulty  PATIENT EDUCATION: See below.Reviewed HEP established in hospital prior to discharge. Person educated: Patient Education method: Medical Illustrator Education comprehension: verbalized understanding and returned demonstration, handout  HOME EXERCISE PROGRAM Created by Chad Applegate Dec 26th, 2024 View at [email or web address] using code: [email or web address]  Page 1 of 1 3 Exercises  hip abduction Exercise is done supine with knee's bent and feet flat. Pull you belly into your spine, and perform the exercise with no pain Repeat 10 Times Hold 10 Seconds Complete 2 Sets Perform 2 Times a Day PEANUT BALL - SHORT ARC QUAD - SAQ  Start by placing a peanut ball (rolled  up pillow) under your knee. Next, slowly raise your lower leg and foot as you straighten your knee. Return to starting position and repeat. Repeat 20 Times Hold 3 Seconds Complete 2 Sets Perform 2 Times a Day LONG ARC QUAD - LAQ - HIGH SEAT While seated with your knee in a bent position, slowly straighten your knee as you raise your foot upwards as shown. Then bend  it back as far as you can. Repeat 10 Times Hold 5 Seconds Complete 2 Sets Perform 4 Times a Day  TODAY's TX:                                   06/12/23 EXERCISE LOG    LT hip      Exercise Repetitions and Resistance Comments  Nustep  L3 x 15 minutes; seat 11    Ue's only   Rocker board  5 minutes BUE support  Marching on foam  3 minutes   Lateral  and forward step up and over  4 step  2 x 10 reps    Step up with march     Balance beam Side stepping  x 3 mins   LAQs 4# 2x10 hold 5 secs   XTS side stepping X 10 side stepping RT    Blank cell = exercise not performed today   Modalities: no adverse reaction to today's modalities  Date:  Cold Pack: Hip, 15 mins, Pain                06/06/23                    EXERCISE LOG  Exercise Repetitions and Resistance Comments  Nustep  Level 4 x 15 minutes; seat 14   Weight Shifts Airex x 2 mins   Rockerboard 5 minutes   Supine hip abduction Red theraband x 3 minutes   SAQ's    LAQ's 5 minutes 3#    Modalities  Date:  Unattended Estim: Hip, IFC 80-150 Hz, 15 mins, Pain Cold Pack: Hip, 15 mins, Pain and Edema   ASSESSMENT:  CLINICAL IMPRESSION:   FOTO  Patent arrived today doing fairly well with LT hip 2/10 soreness. He was able to continue with LT LE strengthening as well as proprioception Act's and did well. XTS blue tband resisted side stepping was added today and he did well.    OBJECTIVE IMPAIRMENTS: Abnormal gait, decreased activity tolerance, decreased mobility, decreased ROM, decreased strength, increased muscle spasms, and pain.   ACTIVITY LIMITATIONS: carrying, lifting, bending, sleeping, bed mobility, and locomotion level  PARTICIPATION LIMITATIONS: meal prep, cleaning, laundry, driving, shopping, community activity, occupation, and yard work  PERSONAL FACTORS: Time since onset of injury/illness/exacerbation are also affecting patient's functional outcome.   REHAB POTENTIAL: Excellent  CLINICAL DECISION MAKING:  Stable/uncomplicated  EVALUATION COMPLEXITY: Low   GOALS:  LONG TERM GOALS: Target date: 07/10/23.  Ind with a HEP. Goal status: INITIAL  2.  Full active left knee extension.  Goal status: INITIAL  3.  Improve LE strength to a solid 4+/5 to increase stability for functional tasks.  Goal status: INITIAL  4.  Perform a reciprocating stair gait with one railing.  Goal status: INITIAL  5.  Walk a community distance without assistive device.  Goal status: INITIAL  6.  Perform ADL's with pain not >2-3/10.  Goal status: INITIAL   PLAN:  PT FREQUENCY: 2x/week  PT DURATION:  6 weeks  PLANNED INTERVENTIONS: 97110-Therapeutic exercises, 97530- Therapeutic activity, [id number]- Neuromuscular re-education, 97535- Self Care, 02859- Manual therapy, 97014- Electrical stimulation (unattended), Patient/Family education, Stair training, Cryotherapy, and Moist heat  PLAN FOR NEXT SESSION: Nustep, gait activities.  Left knee extension stretching, left LE strengthening.     Jeda Pardue,CHRIS, PTA 06/12/2023, 9:02 AM

## 2023-06-26 HISTORY — PX: OTHER SURGICAL HISTORY: SHX169

## 2023-06-29 ENCOUNTER — Ambulatory Visit: Payer: No Typology Code available for payment source | Admitting: *Deleted

## 2023-06-29 ENCOUNTER — Encounter: Payer: Self-pay | Admitting: *Deleted

## 2023-06-29 DIAGNOSIS — M25552 Pain in left hip: Secondary | ICD-10-CM | POA: Diagnosis not present

## 2023-06-29 DIAGNOSIS — M6281 Muscle weakness (generalized): Secondary | ICD-10-CM

## 2023-06-29 NOTE — Therapy (Signed)
OUTPATIENT PHYSICAL THERAPY LOWER EXTREMITY TREATMENT   Patient Name: Allen Herring MRN: [id number] DOB:1961/01/24, 63 y.o., male Today's Date: 06/29/2023  END OF SESSION:  PT End of Session - 06/29/23 0853     Visit Number 6    Number of Visits 12    Date for PT Re-Evaluation 07/10/23    PT Start Time 0800    PT Stop Time 0850    PT Time Calculation (min) 50 min                Past Medical History:  Diagnosis Date   Diverticulitis    GERD (gastroesophageal reflux disease)    History of cholecystectomy    Hypertension    Polycythemia 10/17/2011   Past Surgical History:  Procedure Laterality Date   CERVICAL SPINE SURGERY  05/2016   CHOLECYSTECTOMY N/A 05/19/2022   Procedure: LAPAROSCOPIC CHOLECYSTECTOMY;  Surgeon: Lucretia Roers, MD;  Location: [facility];  Service: General;  Laterality: N/A;   Dental extractions     Patient Active Problem List   Diagnosis Date Noted   Cholecystitis, acute 05/19/2022   Biliary sludge 05/15/2022   Hyperlipidemia 07/03/2019   BMI 30.0-30.9,adult 08/29/2018   Carpal tunnel syndrome of left wrist 10/17/2017   Ulnar nerve external compression syndrome, left 10/17/2017   Allergic rhinitis 08/07/2017   Hypertension 10/18/2016   REFERRING PROVIDER: Weber Cooks MD  REFERRING DIAG: S/p left posterior total hip replacement.  THERAPY DIAG:  Pain in left hip  Muscle weakness (generalized)  Rationale for Evaluation and Treatment: Rehabilitation  ONSET DATE: 05/26/23 (surgery date).  SUBJECTIVE:   SUBJECTIVE STATEMENT: Patient reports that he is a little sore today. LT hip sore 2/10. BTW next Friday PERTINENT HISTORY: ACDF. PAIN:  Are you having pain? Yes: NPRS scale: 2/10. Pain location: Left hip. Pain description: Ache. Aggravating factors: Movement. Relieving factors: Rest.  PRECAUTIONS: Posterior hip.  No ultrasound.   WEIGHT BEARING RESTRICTIONS: No  FALLS:  Has patient fallen in last 6 months?  No  LIVING ENVIRONMENT: Lives in: House/apartment Has following equipment at home: Walker - 2 wheeled  PLOF: Independent  PATIENT GOALS: Get around without left hip pain.    OBJECTIVE:  Note: Objective measures were completed at Evaluation unless otherwise noted.   PATIENT SURVEYS:  FOTO 38.17.   PALPATION: Left hip Aquacel intact.  Tender to palpation over left proximal gluteal musculature.  LOWER EXTREMITY ROM:  In supine:  Left knee extension is -10 degrees.  LOWER EXTREMITY MMT:  Left knee extension strength is graded grossly at 3-/5.  Left hip abduction ~3/5.    TRANSITIONS:  Min+ assist provided to patient to transition from sit to supine to sit.    GAIT: The patient is walking safely with a FWW with a decrease in step and stride length.  Stairs performed in a non-reciprocating fashion without difficulty  PATIENT EDUCATION: See below.Reviewed HEP established in hospital prior to discharge. Person educated: Patient Education method: Medical illustrator Education comprehension: verbalized understanding and returned demonstration, handout  HOME EXERCISE PROGRAM Created by [place] [employer] Dec 26th, 2024 View at [email or web address] using code: [email or web address]  Page 1 of 1 3 Exercises  hip abduction Exercise is done supine with knee's bent and feet flat. Pull you belly into your spine, and perform the exercise with no pain Repeat 10 Times Hold 10 Seconds Complete 2 Sets Perform 2 Times a Day PEANUT BALL - SHORT ARC QUAD - SAQ  Start by placing a peanut ball (rolled up pillow) under your  knee. Next, slowly raise your lower leg and foot as you straighten your knee. Return to starting position and repeat. Repeat 20 Times Hold 3 Seconds Complete 2 Sets Perform 2 Times a Day LONG ARC QUAD - LAQ - HIGH SEAT While seated with your knee in a bent position, slowly straighten your knee as you raise your foot upwards as shown. Then bend it back as far as you  can. Repeat 10 Times Hold 5 Seconds Complete 2 Sets Perform 4 Times a Day  TODAY's TX:                                   06/29/23 EXERCISE       LOG    LT hip      Exercise Repetitions and Resistance Comments  Nustep  L4 x 15 minutes; seat 11    Ue's only   Rocker board  5 minutes   DF/PF and balance BUE support  Marching on foam     Lateral  and forward step up  6" step  2 x 10 reps    Step up with march     Balance beam    LAQs 5#  3x10 hold 5 secs   XTS side stepping 3 X 10 side stepping RT   blue    Blank cell = exercise not performed today   Modalities: no adverse reaction to today's modalities  Date:  Cold Pack: Hip,   mins, Pain                06/06/23                    EXERCISE LOG  Exercise Repetitions and Resistance Comments  Nustep  Level 4 x 15 minutes; seat 14   Weight Shifts Airex x 2 mins   Rockerboard 5 minutes   Supine hip abduction Red theraband x 3 minutes   SAQ's    LAQ's 5 minutes 3#    Modalities  Date:  Unattended Estim: Hip, IFC 80-150 Hz, 15 mins, Pain Cold Pack: Hip, 15 mins, Pain and Edema   ASSESSMENT:  CLINICAL IMPRESSION:    Patent arrived today doing fairly well with LT hip 2/10 soreness. Rx focused on LT LE strengthening as well as proprioception Act's. Pt's CC is problems going up and down steps. XTS blue tband resisted side stepping was performed again and tolerated well.  LTG's 1 and 5 Met today.    OBJECTIVE IMPAIRMENTS: Abnormal gait, decreased activity tolerance, decreased mobility, decreased ROM, decreased strength, increased muscle spasms, and pain.   ACTIVITY LIMITATIONS: carrying, lifting, bending, sleeping, bed mobility, and locomotion level  PARTICIPATION LIMITATIONS: meal prep, cleaning, laundry, driving, shopping, community activity, occupation, and yard work  PERSONAL FACTORS: Time since onset of injury/illness/exacerbation are also affecting patient's functional outcome.   REHAB POTENTIAL: Excellent  CLINICAL  DECISION MAKING: Stable/uncomplicated  EVALUATION COMPLEXITY: Low   GOALS:  LONG TERM GOALS: Target date: 07/10/23.  Ind with a HEP. Goal status:  MET  2.  Full active left knee extension.  Goal status: On going  3.  Improve LE strength to a solid 4+/5 to increase stability for functional tasks.  Goal status: On going  4.  Perform a reciprocating stair gait with one railing.  Goal status: On going  5.  Walk a community distance without assistive device.  Goal status: MET  6.  Perform ADL's with  pain not >2-3/10.  Goal status: On going   PLAN:  PT FREQUENCY: 2x/week  PT DURATION: 6 weeks  PLANNED INTERVENTIONS: 97110-Therapeutic exercises, 97530- Therapeutic activity, [id number]- Neuromuscular re-education, 97535- Self Care, 82956- Manual therapy, 97014- Electrical stimulation (unattended), Patient/Family education, Stair training, Cryotherapy, and Moist heat  PLAN FOR NEXT SESSION: Nustep, gait activities.  Left knee extension stretching, left LE strengthening.     Lillieann Pavlich,CHRIS, PTA 06/29/2023, 8:54 AM

## 2023-06-30 ENCOUNTER — Ambulatory Visit: Payer: No Typology Code available for payment source

## 2023-06-30 DIAGNOSIS — M25662 Stiffness of left knee, not elsewhere classified: Secondary | ICD-10-CM

## 2023-06-30 DIAGNOSIS — M25552 Pain in left hip: Secondary | ICD-10-CM | POA: Diagnosis not present

## 2023-06-30 DIAGNOSIS — M6281 Muscle weakness (generalized): Secondary | ICD-10-CM

## 2023-06-30 NOTE — Therapy (Signed)
OUTPATIENT PHYSICAL THERAPY LOWER EXTREMITY TREATMENT   Patient Name: Allen Herring MRN: [id number] DOB:06/26/1960, 63 y.o., male Today's Date: 06/30/2023  END OF SESSION:  PT End of Session - 06/30/23 0804     Visit Number 7    Number of Visits 12    Date for PT Re-Evaluation 07/10/23    PT Start Time 0800    PT Stop Time 0855    PT Time Calculation (min) 55 min                Past Medical History:  Diagnosis Date   Diverticulitis    GERD (gastroesophageal reflux disease)    History of cholecystectomy    Hypertension    Polycythemia 10/17/2011   Past Surgical History:  Procedure Laterality Date   CERVICAL SPINE SURGERY  05/2016   CHOLECYSTECTOMY N/A 05/19/2022   Procedure: LAPAROSCOPIC CHOLECYSTECTOMY;  Surgeon: Lucretia Roers, MD;  Location: [facility];  Service: General;  Laterality: N/A;   Dental extractions     Patient Active Problem List   Diagnosis Date Noted   Cholecystitis, acute 05/19/2022   Biliary sludge 05/15/2022   Hyperlipidemia 07/03/2019   BMI 30.0-30.9,adult 08/29/2018   Carpal tunnel syndrome of left wrist 10/17/2017   Ulnar nerve external compression syndrome, left 10/17/2017   Allergic rhinitis 08/07/2017   Hypertension 10/18/2016   REFERRING PROVIDER: Weber Cooks MD  REFERRING DIAG: S/p left posterior total hip replacement.  THERAPY DIAG:  Pain in left hip  Muscle weakness (generalized)  Stiffness of left knee, not elsewhere classified  Rationale for Evaluation and Treatment: Rehabilitation  ONSET DATE: 05/26/23 (surgery date).  SUBJECTIVE:   SUBJECTIVE STATEMENT: Patient reports that he is a little sore today after yesterday's treatment session.   PERTINENT HISTORY: ACDF. PAIN:  Are you having pain? Yes: NPRS scale: does not give number/10. Pain location: Left hip. Pain description: Ache. Aggravating factors: Movement. Relieving factors: Rest.  PRECAUTIONS: Posterior hip.  No ultrasound.   WEIGHT  BEARING RESTRICTIONS: No  FALLS:  Has patient fallen in last 6 months? No  LIVING ENVIRONMENT: Lives in: House/apartment Has following equipment at home: Walker - 2 wheeled  PLOF: Independent  PATIENT GOALS: Get around without left hip pain.    OBJECTIVE:  Note: Objective measures were completed at Evaluation unless otherwise noted.   PATIENT SURVEYS:  FOTO 38.17.   PALPATION: Left hip Aquacel intact.  Tender to palpation over left proximal gluteal musculature.  LOWER EXTREMITY ROM:  In supine:  Left knee extension is -10 degrees.  LOWER EXTREMITY MMT:  Left knee extension strength is graded grossly at 3-/5.  Left hip abduction ~3/5.    TRANSITIONS:  Min+ assist provided to patient to transition from sit to supine to sit.  GAIT: The patient is walking safely with a FWW with a decrease in step and stride length.  Stairs performed in a non-reciprocating fashion without difficulty  PATIENT EDUCATION: See below.Reviewed HEP established in hospital prior to discharge. Person educated: Patient Education method: Medical illustrator Education comprehension: verbalized understanding and returned demonstration, handout  HOME EXERCISE PROGRAM Created by [place] [employer] Dec 26th, 2024 View at [email or web address] using code: [email or web address]  Page 1 of 1 3 Exercises  hip abduction Exercise is done supine with knee's bent and feet flat. Pull you belly into your spine, and perform the exercise with no pain Repeat 10 Times Hold 10 Seconds Complete 2 Sets Perform 2 Times a Day PEANUT BALL - SHORT ARC QUAD - SAQ  Start by placing  a peanut ball (rolled up pillow) under your knee. Next, slowly raise your lower leg and foot as you straighten your knee. Return to starting position and repeat. Repeat 20 Times Hold 3 Seconds Complete 2 Sets Perform 2 Times a Day LONG ARC QUAD - LAQ - HIGH SEAT While seated with your knee in a bent position, slowly straighten your knee as  you raise your foot upwards as shown. Then bend it back as far as you can. Repeat 10 Times Hold 5 Seconds Complete 2 Sets Perform 4 Times a Day  TODAY's TX:                                   06/29/23 EXERCISE       LOG    LT hip      Exercise Repetitions and Resistance Comments  Nustep     Recumbent Bike Lvl 3-5 x 15 mins; seat 6   Rocker board  5 minutes   DF/PF and balance BUE support  Marching on foam     Lateral  and forward step up  6" step x 25 reps    Standing hip abduction 2# x 20 reps   Standing marches AROM x 20 reps   LAQs 5#  3x10 hold 5 secs   XTS side stepping 3 X 10 side stepping RT   blue    Blank cell = exercise not performed today   Modalities  Date:  Cold Pack: Hip, 10 mins, Pain                 06/06/23                    EXERCISE LOG  Exercise Repetitions and Resistance Comments  Nustep  Level 4 x 15 minutes; seat 14   Weight Shifts Airex x 2 mins   Rockerboard 5 minutes   Supine hip abduction Red theraband x 3 minutes   SAQ's    LAQ's 5 minutes 3#    Modalities  Date:  Unattended Estim: Hip, IFC 80-150 Hz, 15 mins, Pain Cold Pack: Hip, 15 mins, Pain and Edema   ASSESSMENT:  CLINICAL IMPRESSION:    Pt arrives for today's treatment session reporting minimal soreness from yesterday's treatment, but states that he feels good.  Pt able to tolerate progress to recumbent bike today without discomfort.  Pt reported increased pain with standing marches with 2# weight donned, but reports relief after removal of weight.  Pt also instructed in standing hip abduction with min cues to avoid compensatory leaning with each rep.  Normal responses to ice pack noted upon removal.  Pt denied any pain at completion of today's treatment session.  OBJECTIVE IMPAIRMENTS: Abnormal gait, decreased activity tolerance, decreased mobility, decreased ROM, decreased strength, increased muscle spasms, and pain.   ACTIVITY LIMITATIONS: carrying, lifting, bending, sleeping, bed  mobility, and locomotion level  PARTICIPATION LIMITATIONS: meal prep, cleaning, laundry, driving, shopping, community activity, occupation, and yard work  PERSONAL FACTORS: Time since onset of injury/illness/exacerbation are also affecting patient's functional outcome.   REHAB POTENTIAL: Excellent  CLINICAL DECISION MAKING: Stable/uncomplicated  EVALUATION COMPLEXITY: Low   GOALS:  LONG TERM GOALS: Target date: 07/10/23.  Ind with a HEP. Goal status:  MET  2.  Full active left knee extension.  Goal status: On going  3.  Improve LE strength to a solid 4+/5 to increase stability for functional tasks.  Goal status: On going  4.  Perform a reciprocating stair gait with one railing.  Goal status: On going  5.  Walk a community distance without assistive device.  Goal status: MET  6.  Perform ADL's with pain not >2-3/10.  Goal status: On going   PLAN:  PT FREQUENCY: 2x/week  PT DURATION: 6 weeks  PLANNED INTERVENTIONS: 97110-Therapeutic exercises, 97530- Therapeutic activity, [id number]- Neuromuscular re-education, 97535- Self Care, 16109- Manual therapy, 97014- Electrical stimulation (unattended), Patient/Family education, Stair training, Cryotherapy, and Moist heat  PLAN FOR NEXT SESSION: Nustep, gait activities.  Left knee extension stretching, left LE strengthening.     Newman Pies, PTA 06/30/2023, 9:33 AM

## 2023-07-03 ENCOUNTER — Ambulatory Visit: Payer: No Typology Code available for payment source

## 2023-07-03 DIAGNOSIS — M25662 Stiffness of left knee, not elsewhere classified: Secondary | ICD-10-CM

## 2023-07-03 DIAGNOSIS — M25552 Pain in left hip: Secondary | ICD-10-CM | POA: Diagnosis not present

## 2023-07-03 DIAGNOSIS — M6281 Muscle weakness (generalized): Secondary | ICD-10-CM

## 2023-07-03 NOTE — Therapy (Signed)
OUTPATIENT PHYSICAL THERAPY LOWER EXTREMITY TREATMENT   Patient Name: Allen Herring MRN: [id number] DOB:18-Jul-1960, 63 y.o., male Today's Date: 07/03/2023  END OF SESSION:  PT End of Session - 07/03/23 0802     Visit Number 8    Number of Visits 12    Date for PT Re-Evaluation 07/10/23    PT Start Time 0800    PT Stop Time 0848    PT Time Calculation (min) 48 min                Past Medical History:  Diagnosis Date   Diverticulitis    GERD (gastroesophageal reflux disease)    History of cholecystectomy    Hypertension    Polycythemia 10/17/2011   Past Surgical History:  Procedure Laterality Date   CERVICAL SPINE SURGERY  05/2016   CHOLECYSTECTOMY N/A 05/19/2022   Procedure: LAPAROSCOPIC CHOLECYSTECTOMY;  Surgeon: Lucretia Roers, MD;  Location: [facility];  Service: General;  Laterality: N/A;   Dental extractions     Patient Active Problem List   Diagnosis Date Noted   Cholecystitis, acute 05/19/2022   Biliary sludge 05/15/2022   Hyperlipidemia 07/03/2019   BMI 30.0-30.9,adult 08/29/2018   Carpal tunnel syndrome of left wrist 10/17/2017   Ulnar nerve external compression syndrome, left 10/17/2017   Allergic rhinitis 08/07/2017   Hypertension 10/18/2016   REFERRING PROVIDER: Weber Cooks MD  REFERRING DIAG: S/p left posterior total hip replacement.  THERAPY DIAG:  Pain in left hip  Muscle weakness (generalized)  Stiffness of left knee, not elsewhere classified  Rationale for Evaluation and Treatment: Rehabilitation  ONSET DATE: 05/26/23 (surgery date).  SUBJECTIVE:   SUBJECTIVE STATEMENT: Patient reports 3/10 left hip pain today.   PERTINENT HISTORY: ACDF. PAIN:  Are you having pain? Yes: NPRS scale: 3/10. Pain location: Left hip. Pain description: Ache. Aggravating factors: Movement. Relieving factors: Rest.  PRECAUTIONS: Posterior hip.  No ultrasound.   WEIGHT BEARING RESTRICTIONS: No  FALLS:  Has patient fallen in last 6  months? No  LIVING ENVIRONMENT: Lives in: House/apartment Has following equipment at home: Walker - 2 wheeled  PLOF: Independent  PATIENT GOALS: Get around without left hip pain.    OBJECTIVE:  Note: Objective measures were completed at Evaluation unless otherwise noted.   PATIENT SURVEYS:  FOTO 38.17.   PALPATION: Left hip Aquacel intact.  Tender to palpation over left proximal gluteal musculature.  LOWER EXTREMITY ROM:  In supine:  Left knee extension is -10 degrees.  LOWER EXTREMITY MMT:  Left knee extension strength is graded grossly at 3-/5.  Left hip abduction ~3/5.    TRANSITIONS:  Min+ assist provided to patient to transition from sit to supine to sit.  GAIT: The patient is walking safely with a FWW with a decrease in step and stride length.  Stairs performed in a non-reciprocating fashion without difficulty  PATIENT EDUCATION: See below.Reviewed HEP established in hospital prior to discharge. Person educated: Patient Education method: Medical illustrator Education comprehension: verbalized understanding and returned demonstration, handout  HOME EXERCISE PROGRAM Created by [place] [employer] Dec 26th, 2024 View at [email or web address] using code: [email or web address]  Page 1 of 1 3 Exercises  hip abduction Exercise is done supine with knee's bent and feet flat. Pull you belly into your spine, and perform the exercise with no pain Repeat 10 Times Hold 10 Seconds Complete 2 Sets Perform 2 Times a Day PEANUT BALL - SHORT ARC QUAD - SAQ  Start by placing a peanut ball (rolled up pillow) under your knee.  Next, slowly raise your lower leg and foot as you straighten your knee. Return to starting position and repeat. Repeat 20 Times Hold 3 Seconds Complete 2 Sets Perform 2 Times a Day LONG ARC QUAD - LAQ - HIGH SEAT While seated with your knee in a bent position, slowly straighten your knee as you raise your foot upwards as shown. Then bend it back as far as  you can. Repeat 10 Times Hold 5 Seconds Complete 2 Sets Perform 4 Times a Day  TODAY's TX:                                   07/03/23 EXERCISE       LOG    LT hip      Exercise Repetitions and Resistance Comments  Nustep     Recumbent Bike Lvl 5-6 x 15 mins; seat 6   Rocker board  5 minutes   DF/PF and balance Minimal UE support  Cybex Knee Flexion 50# x 3 mins   Cybex Knee Extension 10# x 3 mins   Cybex Leg Press 3 plates; seat 8 x 3 mins   Lateral  and forward step up  6" step x 30 reps    Standing hip abduction 2# x 25 reps   Standing marches AROM x 25 reps   LAQs    XTS side stepping     Blank cell = exercise not performed today   Modalities  Date:  Cold Pack: Hip, 10 mins, Pain                 06/06/23                    EXERCISE LOG  Exercise Repetitions and Resistance Comments  Nustep  Level 4 x 15 minutes; seat 14   Weight Shifts Airex x 2 mins   Rockerboard 5 minutes   Supine hip abduction Red theraband x 3 minutes   SAQ's    LAQ's 5 minutes 3#    Modalities  Date:  Unattended Estim: Hip, IFC 80-150 Hz, 15 mins, Pain Cold Pack: Hip, 15 mins, Pain and Edema   ASSESSMENT:  CLINICAL IMPRESSION:    Pt arrives for today's treatment session reporting 3/10 left hip pain.  Pt reports soreness and fatigue after last treatment session.  Pt able to navigate four stairs today with reciprocal pattern with use of single rail, meeting his LTG.  Pt introduced to cybex machinery today with min cues required for eccentric control.  Normal responses to cold pack noted upon removal.  Pt reported 1/10 left hip pain at completion of today's treatment session.   OBJECTIVE IMPAIRMENTS: Abnormal gait, decreased activity tolerance, decreased mobility, decreased ROM, decreased strength, increased muscle spasms, and pain.   ACTIVITY LIMITATIONS: carrying, lifting, bending, sleeping, bed mobility, and locomotion level  PARTICIPATION LIMITATIONS: meal prep, cleaning, laundry, driving,  shopping, community activity, occupation, and yard work  PERSONAL FACTORS: Time since onset of injury/illness/exacerbation are also affecting patient's functional outcome.   REHAB POTENTIAL: Excellent  CLINICAL DECISION MAKING: Stable/uncomplicated  EVALUATION COMPLEXITY: Low   GOALS:  LONG TERM GOALS: Target date: 07/10/23.  Ind with a HEP. Goal status:  MET  2.  Full active left knee extension.  Goal status: On going  3.  Improve LE strength to a solid 4+/5 to increase stability for functional tasks.  Goal status: On going  4.  Perform  a reciprocating stair gait with one railing.  Goal status: MET  5.  Walk a community distance without assistive device.  Goal status: MET  6.  Perform ADL's with pain not >2-3/10.  Goal status: On going   PLAN:  PT FREQUENCY: 2x/week  PT DURATION: 6 weeks  PLANNED INTERVENTIONS: 97110-Therapeutic exercises, 97530- Therapeutic activity, [id number]- Neuromuscular re-education, 97535- Self Care, 16109- Manual therapy, 97014- Electrical stimulation (unattended), Patient/Family education, Stair training, Cryotherapy, and Moist heat  PLAN FOR NEXT SESSION: Nustep, gait activities.  Left knee extension stretching, left LE strengthening.     Newman Pies, PTA 07/03/2023, 9:03 AM

## 2023-07-04 ENCOUNTER — Ambulatory Visit: Payer: No Typology Code available for payment source

## 2023-07-04 DIAGNOSIS — M6281 Muscle weakness (generalized): Secondary | ICD-10-CM

## 2023-07-04 DIAGNOSIS — M25552 Pain in left hip: Secondary | ICD-10-CM

## 2023-07-04 NOTE — Therapy (Signed)
OUTPATIENT PHYSICAL THERAPY LOWER EXTREMITY TREATMENT   Patient Name: Allen Herring MRN: [id number] DOB:February 01, 1961, 63 y.o., male Today's Date: 07/04/2023  END OF SESSION:  PT End of Session - 07/04/23 0801     Visit Number 9    Number of Visits 12    Date for PT Re-Evaluation 07/10/23    PT Start Time 0800    PT Stop Time 0851    PT Time Calculation (min) 51 min                Past Medical History:  Diagnosis Date   Diverticulitis    GERD (gastroesophageal reflux disease)    History of cholecystectomy    Hypertension    Polycythemia 10/17/2011   Past Surgical History:  Procedure Laterality Date   CERVICAL SPINE SURGERY  05/2016   CHOLECYSTECTOMY N/A 05/19/2022   Procedure: LAPAROSCOPIC CHOLECYSTECTOMY;  Surgeon: Lucretia Roers, MD;  Location: [facility];  Service: General;  Laterality: N/A;   Dental extractions     Patient Active Problem List   Diagnosis Date Noted   Cholecystitis, acute 05/19/2022   Biliary sludge 05/15/2022   Hyperlipidemia 07/03/2019   BMI 30.0-30.9,adult 08/29/2018   Carpal tunnel syndrome of left wrist 10/17/2017   Ulnar nerve external compression syndrome, left 10/17/2017   Allergic rhinitis 08/07/2017   Hypertension 10/18/2016   REFERRING PROVIDER: Weber Cooks MD  REFERRING DIAG: S/p left posterior total hip replacement.  THERAPY DIAG:  Pain in left hip  Muscle weakness (generalized)  Rationale for Evaluation and Treatment: Rehabilitation  ONSET DATE: 05/26/23 (surgery date).  SUBJECTIVE:   SUBJECTIVE STATEMENT: Patient reports 2-3/10 left hip pain today.  Pt reports minimal soreness after yesterday's treatment session.  PERTINENT HISTORY: ACDF. PAIN:  Are you having pain? Yes: NPRS scale: 2-3/10. Pain location: Left hip. Pain description: Ache. Aggravating factors: Movement. Relieving factors: Rest.  PRECAUTIONS: Posterior hip.  No ultrasound.   WEIGHT BEARING RESTRICTIONS: No  FALLS:  Has patient  fallen in last 6 months? No  LIVING ENVIRONMENT: Lives in: House/apartment Has following equipment at home: Walker - 2 wheeled  PLOF: Independent  PATIENT GOALS: Get around without left hip pain.    OBJECTIVE:  Note: Objective measures were completed at Evaluation unless otherwise noted.   PATIENT SURVEYS:  FOTO 38.17.   PALPATION: Left hip Aquacel intact.  Tender to palpation over left proximal gluteal musculature.  LOWER EXTREMITY ROM:  In supine:  Left knee extension is -10 degrees.  LOWER EXTREMITY MMT:  Left knee extension strength is graded grossly at 3-/5.  Left hip abduction ~3/5.    TRANSITIONS:  Min+ assist provided to patient to transition from sit to supine to sit.  GAIT: The patient is walking safely with a FWW with a decrease in step and stride length.  Stairs performed in a non-reciprocating fashion without difficulty  PATIENT EDUCATION: See below.Reviewed HEP established in hospital prior to discharge. Person educated: Patient Education method: Medical illustrator Education comprehension: verbalized understanding and returned demonstration, handout  HOME EXERCISE PROGRAM Created by [place] [employer] Dec 26th, 2024 View at [email or web address] using code: [email or web address]  Page 1 of 1 3 Exercises  hip abduction Exercise is done supine with knee's bent and feet flat. Pull you belly into your spine, and perform the exercise with no pain Repeat 10 Times Hold 10 Seconds Complete 2 Sets Perform 2 Times a Day PEANUT BALL - SHORT ARC QUAD - SAQ  Start by placing a peanut ball (rolled up pillow) under your knee.  Next, slowly raise your lower leg and foot as you straighten your knee. Return to starting position and repeat. Repeat 20 Times Hold 3 Seconds Complete 2 Sets Perform 2 Times a Day LONG ARC QUAD - LAQ - HIGH SEAT While seated with your knee in a bent position, slowly straighten your knee as you raise your foot upwards as shown. Then bend  it back as far as you can. Repeat 10 Times Hold 5 Seconds Complete 2 Sets Perform 4 Times a Day  TODAY's TX:                                   07/04/23 EXERCISE       LOG    LT hip      Exercise Repetitions and Resistance Comments  Nustep     Recumbent Bike Lvl 6-8 x 15 mins; seat 6   Rocker board  5 minutes   DF/PF and balance Minimal UE support  Cybex Knee Flexion 60# x 3 mins   Cybex Knee Extension 20# x 3 mins   Cybex Leg Press 3.5 plates; seat 8 x 3 mins   Lateral  and forward step up  8" step x 25 reps    Standing hip abduction    Standing marches    LAQs    XTS side stepping     Blank cell = exercise not performed today   Modalities  Date:  Cold Pack: Hip, 10 mins, Pain                 06/06/23                    EXERCISE LOG  Exercise Repetitions and Resistance Comments  Nustep  Level 4 x 15 minutes; seat 14   Weight Shifts Airex x 2 mins   Rockerboard 5 minutes   Supine hip abduction Red theraband x 3 minutes   SAQ's    LAQ's 5 minutes 3#    Modalities  Date:  Unattended Estim: Hip, IFC 80-150 Hz, 15 mins, Pain Cold Pack: Hip, 15 mins, Pain and Edema   ASSESSMENT:  CLINICAL IMPRESSION:    Pt arrives for today's treatment session reporting 2-3/10 left hip pain.  Pt reports minimal soreness after yesterday's treatment session.  Pt able to tolerate increased weight with all cybex exercises today with minimal fatigue.  Pt also able to tolerate increased step height today with slight challenge.  Normal responses to ice pack noted upon removal.  Pt reported 1/10 left hip pain at completion of today's treatment session.   OBJECTIVE IMPAIRMENTS: Abnormal gait, decreased activity tolerance, decreased mobility, decreased ROM, decreased strength, increased muscle spasms, and pain.   ACTIVITY LIMITATIONS: carrying, lifting, bending, sleeping, bed mobility, and locomotion level  PARTICIPATION LIMITATIONS: meal prep, cleaning, laundry, driving, shopping, community  activity, occupation, and yard work  PERSONAL FACTORS: Time since onset of injury/illness/exacerbation are also affecting patient's functional outcome.   REHAB POTENTIAL: Excellent  CLINICAL DECISION MAKING: Stable/uncomplicated  EVALUATION COMPLEXITY: Low   GOALS:  LONG TERM GOALS: Target date: 07/10/23.  Ind with a HEP. Goal status:  MET  2.  Full active left knee extension.  Goal status: On going  3.  Improve LE strength to a solid 4+/5 to increase stability for functional tasks.  Goal status: On going  4.  Perform a reciprocating stair gait with one railing.  Goal status: MET  5.  Walk a community distance without assistive device.  Goal status: MET  6.  Perform ADL's with pain not >2-3/10.  Goal status: On going   PLAN:  PT FREQUENCY: 2x/week  PT DURATION: 6 weeks  PLANNED INTERVENTIONS: 97110-Therapeutic exercises, 97530- Therapeutic activity, [id number]- Neuromuscular re-education, 97535- Self Care, 16109- Manual therapy, 97014- Electrical stimulation (unattended), Patient/Family education, Stair training, Cryotherapy, and Moist heat  PLAN FOR NEXT SESSION: Nustep, gait activities.  Left knee extension stretching, left LE strengthening.    Newman Pies, PTA 07/04/2023, 8:54 AM

## 2023-07-28 ENCOUNTER — Encounter: Payer: Self-pay | Admitting: Family Medicine

## 2023-07-28 ENCOUNTER — Ambulatory Visit: Payer: No Typology Code available for payment source | Admitting: Family Medicine

## 2023-07-28 ENCOUNTER — Telehealth: Payer: Self-pay | Admitting: Family Medicine

## 2023-07-28 VITALS — BP 108/76 | HR 78 | Temp 98.3°F | Wt 225.0 lb

## 2023-07-28 DIAGNOSIS — I10 Essential (primary) hypertension: Secondary | ICD-10-CM | POA: Diagnosis not present

## 2023-07-28 MED ORDER — LISINOPRIL-HYDROCHLOROTHIAZIDE 20-25 MG PO TABS: 1.0000 | ORAL_TABLET | Freq: Every day | ORAL | 3 refills | Status: AC

## 2023-07-28 NOTE — Progress Notes (Signed)
   Subjective:    Patient ID: Jyden Kromer, male    DOB: 1960-07-22, 63 y.o.   MRN: [id number]  HPI Here to follow up on HTN. We saw him in December, and we changed his BP medication to Lisinopril HCT. The BP has been well controlled since then and he feels fine. He had his hip replacement surgery, and this went well. After a month of PT, he is now back to work.    Review of Systems  Constitutional: Negative.   Respiratory: Negative.    Cardiovascular: Negative.        Objective:   Physical Exam Constitutional:      Appearance: Normal appearance.  Cardiovascular:     Rate and Rhythm: Normal rate and regular rhythm.     Pulses: Normal pulses.     Heart sounds: Normal heart sounds.  Pulmonary:     Effort: Pulmonary effort is normal.     Breath sounds: Normal breath sounds.  Musculoskeletal:     Right lower leg: No edema.     Left lower leg: No edema.  Neurological:     Mental Status: He is alert.           Assessment & Plan:  HTN, well controlled. We refilled the medication.  Gershon Crane, MD

## 2023-07-28 NOTE — Telephone Encounter (Signed)
Pt had an appt today with Dr. Clent Ridges.  Dr. Clent Ridges ok'd patient that he would be the pt's pcp- verbalized with Dr. Clent Ridges via Harriett Sine.

## 2023-07-31 ENCOUNTER — Other Ambulatory Visit: Payer: Self-pay | Admitting: Family Medicine

## 2023-07-31 NOTE — Telephone Encounter (Signed)
 Copied from CRM [id number]. Topic: Clinical - Medication Refill >> Jul 31, 2023  9:28 AM Florestine Avers wrote: Most Recent Primary Care Visit:  Provider: Gershon Crane A  Department: [facility]  Visit Type: OFFICE VISIT  Date: 05/08/2023  Medication: lisinopril-hydrochlorothiazide (ZESTORETIC) 20-25 MG tablet  Has the patient contacted their pharmacy? Yes (Agent: If no, request that the patient contact the pharmacy for the refill. If patient does not wish to contact the pharmacy document the reason why and proceed with request.) (Agent: If yes, when and what did the pharmacy advise?)  Is this the correct pharmacy for this prescription? Yes If no, delete pharmacy and type the correct one.  This is the patient's preferred pharmacy:  [facility] [address] [place] - [address] [place] [address] [place] [address] Phone: [phone] Fax: [phone]   Has the prescription been filled recently? No  Is the patient out of the medication? Yes  Has the patient been seen for an appointment in the last year OR does the patient have an upcoming appointment? Yes  Can we respond through MyChart? Yes  Agent: Please be advised that Rx refills may take up to 3 business days. We ask that you follow-up with your pharmacy.

## 2023-07-31 NOTE — Telephone Encounter (Signed)
 This RN spoke to pharmacy to verify script was rcvd and is available for pt to pick up. Pt notified and verbalized understanding. No further questions or concerns at this time.
